# Patient Record
Sex: Female | Born: 1975 | Race: Black or African American | Hispanic: No | Smoking: Never smoker
Health system: Southern US, Community
[De-identification: ages and names within clinical notes are randomized; demographics above are authoritative.]

## PROBLEM LIST (undated history)

## (undated) DIAGNOSIS — I10 Essential (primary) hypertension: Secondary | ICD-10-CM

## (undated) HISTORY — PX: TUBAL LIGATION: SHX77

---

## 2004-10-04 ENCOUNTER — Emergency Department: Payer: Self-pay | Admitting: Unknown Physician Specialty

## 2005-05-13 ENCOUNTER — Emergency Department: Payer: Self-pay | Admitting: Emergency Medicine

## 2005-05-19 ENCOUNTER — Emergency Department: Payer: Self-pay | Admitting: Emergency Medicine

## 2005-12-17 ENCOUNTER — Ambulatory Visit: Payer: Self-pay | Admitting: Family Medicine

## 2010-01-16 ENCOUNTER — Emergency Department: Payer: Self-pay | Admitting: Emergency Medicine

## 2010-02-03 ENCOUNTER — Encounter: Payer: Self-pay | Admitting: Obstetrics & Gynecology

## 2010-04-14 ENCOUNTER — Encounter: Payer: Self-pay | Admitting: Maternal and Fetal Medicine

## 2010-06-30 ENCOUNTER — Observation Stay: Payer: Self-pay | Admitting: Obstetrics and Gynecology

## 2010-09-04 ENCOUNTER — Inpatient Hospital Stay: Payer: Self-pay | Admitting: Obstetrics and Gynecology

## 2011-06-30 ENCOUNTER — Emergency Department: Payer: Self-pay | Admitting: Emergency Medicine

## 2014-06-13 ENCOUNTER — Emergency Department: Payer: Self-pay | Admitting: Emergency Medicine

## 2014-06-13 LAB — URINALYSIS, COMPLETE
Bacteria: NONE SEEN
Bilirubin,UR: NEGATIVE
Blood: NEGATIVE
Glucose,UR: NEGATIVE mg/dL (ref 0–75)
KETONE: NEGATIVE
LEUKOCYTE ESTERASE: NEGATIVE
Nitrite: NEGATIVE
PROTEIN: NEGATIVE
Ph: 6 (ref 4.5–8.0)
SPECIFIC GRAVITY: 1.005 (ref 1.003–1.030)
Squamous Epithelial: 4
WBC UR: 2 /HPF (ref 0–5)

## 2014-06-13 LAB — HCG, QUANTITATIVE, PREGNANCY: Beta Hcg, Quant.: <1 m[IU]/mL — ABNORMAL LOW

## 2014-06-13 LAB — COMPREHENSIVE METABOLIC PANEL
ALBUMIN: 3.1 g/dL — AB (ref 3.4–5.0)
Alkaline Phosphatase: 67 U/L
Anion Gap: 15 (ref 7–16)
BILIRUBIN TOTAL: 0.3 mg/dL (ref 0.2–1.0)
BUN: 11 mg/dL (ref 7–18)
CHLORIDE: 111 mmol/L — AB (ref 98–107)
Calcium, Total: 8.1 mg/dL — ABNORMAL LOW (ref 8.5–10.1)
Co2: 15 mmol/L — ABNORMAL LOW (ref 21–32)
Creatinine: 0.73 mg/dL (ref 0.60–1.30)
EGFR (African American): >60
EGFR (Non-African Amer.): >60
Glucose: 83 mg/dL (ref 65–99)
OSMOLALITY: 280 (ref 275–301)
POTASSIUM: 4.1 mmol/L (ref 3.5–5.1)
SGOT(AST): 30 U/L (ref 15–37)
SGPT (ALT): 32 U/L
Sodium: 141 mmol/L (ref 136–145)
Total Protein: 6.5 g/dL (ref 6.4–8.2)

## 2014-06-13 LAB — CBC
HCT: 44.1 % (ref 35.0–47.0)
HGB: 14.2 g/dL (ref 12.0–16.0)
MCH: 26.5 pg (ref 26.0–34.0)
MCHC: 32.3 g/dL (ref 32.0–36.0)
MCV: 82 fL (ref 80–100)
PLATELETS: 192 10*3/uL (ref 150–440)
RBC: 5.36 10*6/uL — ABNORMAL HIGH (ref 3.80–5.20)
RDW: 16.1 % — ABNORMAL HIGH (ref 11.5–14.5)
WBC: 14.9 10*3/uL — ABNORMAL HIGH (ref 3.6–11.0)

## 2014-06-13 LAB — TROPONIN I: Troponin-I: <0.02 ng/mL

## 2016-04-29 ENCOUNTER — Emergency Department: Payer: 59

## 2016-04-29 ENCOUNTER — Emergency Department
Admission: EM | Admit: 2016-04-29 | Discharge: 2016-04-29 | Disposition: A | Discharge status: Home / Self Care | Payer: 59 | Attending: Emergency Medicine | Admitting: Emergency Medicine

## 2016-04-29 ENCOUNTER — Encounter: Payer: Self-pay | Admitting: Occupational Medicine

## 2016-04-29 DIAGNOSIS — Y92410 Unspecified street and highway as the place of occurrence of the external cause: Secondary | ICD-10-CM | POA: Insufficient documentation

## 2016-04-29 DIAGNOSIS — S8011XA Contusion of right lower leg, initial encounter: Secondary | ICD-10-CM | POA: Diagnosis not present

## 2016-04-29 DIAGNOSIS — Y9389 Activity, other specified: Secondary | ICD-10-CM | POA: Diagnosis not present

## 2016-04-29 DIAGNOSIS — Y999 Unspecified external cause status: Secondary | ICD-10-CM | POA: Diagnosis not present

## 2016-04-29 DIAGNOSIS — M79604 Pain in right leg: Secondary | ICD-10-CM | POA: Diagnosis present
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
## 2016-04-29 NOTE — ED Notes (Signed)
Discharge instructions reviewed with patient. Questions fielded by this RN. Patient verbalizes understanding of instructions. Patient discharged home in stable condition per Forbach MD . No acute distress noted at time of discharge.   

## 2016-04-29 NOTE — ED Notes (Signed)
MD at bedside. 

## 2016-08-28 ENCOUNTER — Emergency Department
Admission: EM | Admit: 2016-08-28 | Discharge: 2016-08-28 | Disposition: A | Discharge status: Home / Self Care | Payer: 59 | Attending: Emergency Medicine | Admitting: Emergency Medicine

## 2016-08-28 ENCOUNTER — Encounter: Payer: Self-pay | Admitting: Emergency Medicine

## 2016-08-28 DIAGNOSIS — R42 Dizziness and giddiness: Secondary | ICD-10-CM | POA: Insufficient documentation

## 2016-08-28 DIAGNOSIS — Z79899 Other long term (current) drug therapy: Secondary | ICD-10-CM | POA: Insufficient documentation

## 2016-08-28 LAB — URINALYSIS, COMPLETE (UACMP) WITH MICROSCOPIC
Bacteria, UA: NONE SEEN
Bilirubin Urine: NEGATIVE
Glucose, UA: NEGATIVE mg/dL
Hgb urine dipstick: NEGATIVE
Ketones, ur: 5 mg/dL — AB
Nitrite: NEGATIVE
PH: 6 (ref 5.0–8.0)
Protein, ur: 30 mg/dL — AB
SPECIFIC GRAVITY, URINE: 1.015 (ref 1.005–1.030)

## 2016-08-28 LAB — POCT PREGNANCY, URINE: Preg Test, Ur: NEGATIVE

## 2016-08-28 LAB — BASIC METABOLIC PANEL
Anion gap: 7 (ref 5–15)
BUN: 9 mg/dL (ref 6–20)
CHLORIDE: 107 mmol/L (ref 101–111)
CO2: 23 mmol/L (ref 22–32)
CREATININE: 0.62 mg/dL (ref 0.44–1.00)
Calcium: 9 mg/dL (ref 8.9–10.3)
GFR calc Af Amer: >60 mL/min (ref >60)
GFR calc non Af Amer: >60 mL/min (ref >60)
Glucose, Bld: 120 mg/dL — ABNORMAL HIGH (ref 65–99)
Potassium: 3.4 mmol/L — ABNORMAL LOW (ref 3.5–5.1)
SODIUM: 137 mmol/L (ref 135–145)

## 2016-08-28 LAB — CBC
HCT: 36.7 % (ref 35.0–47.0)
Hemoglobin: 12.7 g/dL (ref 12.0–16.0)
MCH: 26.4 pg (ref 26.0–34.0)
MCHC: 34.5 g/dL (ref 32.0–36.0)
MCV: 76.4 fL — AB (ref 80.0–100.0)
PLATELETS: 232 10*3/uL (ref 150–440)
RBC: 4.8 MIL/uL (ref 3.80–5.20)
RDW: 16.2 % — ABNORMAL HIGH (ref 11.5–14.5)
WBC: 13 10*3/uL — AB (ref 3.6–11.0)

## 2016-08-28 MED ORDER — SODIUM CHLORIDE 0.9 % IV SOLN
Freq: Once | INTRAVENOUS | Status: AC
  Administered 2016-08-28: 22:00:00 via INTRAVENOUS

## 2016-08-28 MED ORDER — DIAZEPAM 5 MG PO TABS
5.0000 mg | ORAL_TABLET | Freq: Once | ORAL | Status: AC
  Administered 2016-08-28: 5 mg via ORAL
  Filled 2016-08-28: qty 1

## 2016-08-28 MED ORDER — DIAZEPAM 5 MG PO TABS: 5.0000 mg | ORAL_TABLET | Freq: Three times a day (TID) | ORAL | 0 refills | Status: DC | PRN

## 2016-08-28 MED ORDER — MECLIZINE HCL 25 MG PO TABS
50.0000 mg | ORAL_TABLET | Freq: Once | ORAL | Status: AC
  Administered 2016-08-28: 50 mg via ORAL
  Filled 2016-08-28: qty 2

## 2016-08-28 NOTE — ED Provider Notes (Signed)
[facility] Emergency Department Provider Note        Time seen: ----------------------------------------- 10:10 PM on 08/28/2016 -----------------------------------------    I have reviewed the triage vital signs and the nursing notes.   HISTORY  Chief Complaint Near Syncope    HPI Kaylee Malone is a 41 y.o. female who presents to the ER after feeling lightheaded and dizzy while at work today. Patient reports EMS was called and she felt like she was going to be fine but later today she went to [facility] and was diagnosed with vertigo.Patient states she took the medicine for vertigo but it did not significantly improve her symptoms. She denies fevers, chills or other complaints. She has had a recent viral illness.   History reviewed. No pertinent past medical history.  There are no active problems to display for this patient.   Past Surgical History:  Procedure Laterality Date  . CESAREAN SECTION     x3    Allergies Codeine  Social History Social History  Substance Use Topics  . Smoking status: Never Smoker  . Smokeless tobacco: Never Used  . Alcohol use No    Review of Systems Constitutional: Negative for fever. Cardiovascular: Negative for chest pain. Respiratory: Negative for shortness of breath. Gastrointestinal: Negative for abdominal pain, vomiting and diarrhea. Genitourinary: Negative for dysuria. Musculoskeletal: Negative for back pain. Skin: Negative for rash. Neurological: Negative for headaches, focal weakness or numbness.Positive for vertigo  10-point ROS otherwise negative.  ____________________________________________   PHYSICAL EXAM:  VITAL SIGNS: ED Triage Vitals  Enc Vitals Group     BP 08/28/16 2134 (!) 178/92     Pulse Rate 08/28/16 2134 92     Resp 08/28/16 2134 18     Temp 08/28/16 2134 99.2 F (37.3 C)     Temp Source 08/28/16 2134 Oral     SpO2 08/28/16 2134 95 %     Weight 08/28/16 2135  176 lb (79.8 kg)     Height 08/28/16 2135 5\' 7"  (1.702 m)     Head Circumference --      Peak Flow --      Pain Score 08/28/16 2141 0     Pain Loc --      Pain Edu? --      Excl. in GC? --     Constitutional: Alert and oriented. Well appearing and in no distress. Eyes: Conjunctivae are normal. PERRL. Normal extraocular movements. ENT   Head: Normocephalic and atraumatic.   Nose: No congestion/rhinnorhea.   Mouth/Throat: Mucous membranes are moist.   Neck: No stridor. Cardiovascular: Normal rate, regular rhythm. No murmurs, rubs, or gallops. Respiratory: Normal respiratory effort without tachypnea nor retractions. Breath sounds are clear and equal bilaterally. No wheezes/rales/rhonchi. Gastrointestinal: Soft and nontender. Normal bowel sounds Musculoskeletal: Nontender with normal range of motion in all extremities. No lower extremity tenderness nor edema. Neurologic:  Normal speech and language. No gross focal neurologic deficits are appreciated.  Skin:  Skin is warm, dry and intact. No rash noted. Psychiatric: Mood and affect are normal. Speech and behavior are normal.  ____________________________________________  EKG: Interpreted by me. Sinus rhythm rate of 93 bpm, normal PR intervals, normal QRS, normal QT, normal axis.  ____________________________________________  ED COURSE:  Pertinent labs & imaging results that were available during my care of the patient were reviewed by me and considered in my medical decision making (see chart for details). Patient is in no distress, clinically with peripheral vertigo. We will assess with basic  labs and give IV fluids, Valium orally and meclizine.   Procedures ____________________________________________   LABS (pertinent positives/negatives)  Labs Reviewed  BASIC METABOLIC PANEL - Abnormal; Notable for the following:       Result Value   Potassium 3.4 (*)    Glucose, Bld 120 (*)    All other components within  normal limits  CBC - Abnormal; Notable for the following:    WBC 13.0 (*)    MCV 76.4 (*)    RDW 16.2 (*)    All other components within normal limits  URINALYSIS, COMPLETE (UACMP) WITH MICROSCOPIC - Abnormal; Notable for the following:    Color, Urine YELLOW (*)    APPearance HAZY (*)    Ketones, ur 5 (*)    Protein, ur 30 (*)    Leukocytes, UA SMALL (*)    Squamous Epithelial / LPF 6-30 (*)    All other components within normal limits  POCT PREGNANCY, URINE  CBG MONITORING, ED   ____________________________________________  FINAL ASSESSMENT AND PLAN  Vertigo  Plan: Patient with labs as dictated above. Patient is currently feeling better, vertigo has subsided. She is sitting up and appears improved. She'll be discharged and encouraged to take her meclizine and Valium. She is stable for ENT follow-up if symptoms persist.   Emily Filbert, MD   Note: This note was generated in part or whole with voice recognition software. Voice recognition is usually quite accurate but there are transcription errors that can and very often do occur. I apologize for any typographical errors that were not detected and corrected.     Emily Filbert, MD 08/28/16 [phone]

## 2016-08-28 NOTE — ED Triage Notes (Signed)
Pt states that she was walking at work today and felt light headed and dizzy. Pt reports that EMS was called by a customer and she felt that she was going to be fine at the time. Pt states that later today [facility] went [facility] and was diagnosed with [facility]. [facility] prescribed her Meclizine 25mg , Benzonanate 200mg , and amoxicillin. Pt is in NAD at this time.

## 2016-08-28 NOTE — ED Notes (Signed)
Pt states was diagnosed with vertigo and given antivert [facility]. Pt states she has the sensation of "like i'm in a tornado". Pt states she had 3 episodes of emesis related to vertigo. Pt denies diarrhea, fever. Pt states vertigo improves with lying flat. Skin normal color warm and dry. Pt states she was at work earlier today when she became very dizzy and felt like she was going to faint.

## 2017-01-03 ENCOUNTER — Emergency Department: Payer: 59

## 2017-01-03 ENCOUNTER — Emergency Department
Admission: EM | Admit: 2017-01-03 | Discharge: 2017-01-03 | Disposition: A | Discharge status: Home / Self Care | Payer: 59 | Attending: Emergency Medicine | Admitting: Emergency Medicine

## 2017-01-03 ENCOUNTER — Encounter: Payer: Self-pay | Admitting: Emergency Medicine

## 2017-01-03 DIAGNOSIS — Z885 Allergy status to narcotic agent status: Secondary | ICD-10-CM | POA: Insufficient documentation

## 2017-01-03 DIAGNOSIS — R000 Tachycardia, unspecified: Secondary | ICD-10-CM | POA: Insufficient documentation

## 2017-01-03 LAB — BASIC METABOLIC PANEL
ANION GAP: 9 (ref 5–15)
BUN: 11 mg/dL (ref 6–20)
CALCIUM: 9.5 mg/dL (ref 8.9–10.3)
CO2: 25 mmol/L (ref 22–32)
CREATININE: 0.81 mg/dL (ref 0.44–1.00)
Chloride: 103 mmol/L (ref 101–111)
Glucose, Bld: 161 mg/dL — ABNORMAL HIGH (ref 65–99)
Potassium: 2.8 mmol/L — ABNORMAL LOW (ref 3.5–5.1)
SODIUM: 137 mmol/L (ref 135–145)

## 2017-01-03 LAB — URINALYSIS, COMPLETE (UACMP) WITH MICROSCOPIC
BILIRUBIN URINE: NEGATIVE
GLUCOSE, UA: NEGATIVE mg/dL
Hgb urine dipstick: NEGATIVE
KETONES UR: NEGATIVE mg/dL
NITRITE: NEGATIVE
PH: 6 (ref 5.0–8.0)
Protein, ur: NEGATIVE mg/dL
SPECIFIC GRAVITY, URINE: 1.01 (ref 1.005–1.030)

## 2017-01-03 LAB — POCT PREGNANCY, URINE: Preg Test, Ur: NEGATIVE

## 2017-01-03 LAB — TROPONIN I

## 2017-01-03 LAB — CBC
HCT: 39 % (ref 35.0–47.0)
HEMOGLOBIN: 13.7 g/dL (ref 12.0–16.0)
MCH: 27.7 pg (ref 26.0–34.0)
MCHC: 35.3 g/dL (ref 32.0–36.0)
MCV: 78.5 fL — ABNORMAL LOW (ref 80.0–100.0)
PLATELETS: 255 10*3/uL (ref 150–440)
RBC: 4.96 MIL/uL (ref 3.80–5.20)
RDW: 15.7 % — ABNORMAL HIGH (ref 11.5–14.5)
WBC: 11.4 10*3/uL — AB (ref 3.6–11.0)

## 2017-01-03 MED ORDER — LORAZEPAM 0.5 MG PO TABS: 0.5000 mg | ORAL_TABLET | Freq: Three times a day (TID) | ORAL | 0 refills | Status: AC | PRN

## 2017-01-03 MED ORDER — POTASSIUM CHLORIDE CRYS ER 20 MEQ PO TBCR
40.0000 meq | EXTENDED_RELEASE_TABLET | Freq: Once | ORAL | Status: AC
  Administered 2017-01-03: 40 meq via ORAL
  Filled 2017-01-03: qty 2

## 2017-01-03 MED ORDER — LORAZEPAM 2 MG/ML IJ SOLN
1.0000 mg | Freq: Once | INTRAMUSCULAR | Status: AC
  Administered 2017-01-03: 1 mg via INTRAVENOUS
  Filled 2017-01-03: qty 1

## 2017-01-03 NOTE — ED Provider Notes (Addendum)
[facility] Emergency Department Provider Note       Time seen: ----------------------------------------- 9:03 PM on 01/03/2017 -----------------------------------------     I have reviewed the triage vital signs and the nursing notes.   HISTORY   Chief Complaint Tachycardia    HPI Kaylee Malone is a 41 y.o. female who presents to the ED for feeling like her heart was racing at home. Patient reported heart rate at home of 160 bpm. Patient states she was taken off of amlodipine recently because he was making her tachycardic. She is brought in to triage diaphoretic, does not have any pain at this time. She is also taking hydroxyzine as prescribed by her doctor for anxiety.   History reviewed. No pertinent past medical history.  There are no active problems to display for this patient.   Past Surgical History:  Procedure Laterality Date  . CESAREAN SECTION     x3    Allergies Codeine  Social History Social History  Substance Use Topics  . Smoking status: Never Smoker  . Smokeless tobacco: Never Used  . Alcohol use No    Review of Systems Constitutional: Negative for fever. Eyes: Negative for vision changes ENT:  Negative for congestion, sore throat Cardiovascular: Negative for chest pain.Positive for palpitations Respiratory: Negative for shortness of breath. Gastrointestinal: Negative for abdominal pain, vomiting and diarrhea. Genitourinary: Negative for dysuria. Musculoskeletal: Negative for back pain. Skin: Negative for rash. Positive for diaphoresis Neurological: Negative for headaches, focal weakness or numbness.  All systems negative/normal/unremarkable except as stated in the HPI  ____________________________________________   PHYSICAL EXAM:  VITAL SIGNS: ED Triage Vitals  Enc Vitals Group     BP 01/03/17 2044 (!) 156/108     Pulse Rate 01/03/17 2044 (!) 140     Resp 01/03/17 2044 18     Temp 01/03/17 2044 98.9 F  (37.2 C)     Temp Source 01/03/17 2044 Oral     SpO2 01/03/17 2044 97 %     Weight 01/03/17 2049 186 lb (84.4 kg)     Height 01/03/17 2049 5\' 7"  (1.702 m)     Head Circumference --      Peak Flow --      Pain Score 01/03/17 2044 0     Pain Loc --      Pain Edu? --      Excl. in GC? --     Constitutional: Alert and oriented. Anxious, no distress Eyes: Conjunctivae are normal. Normal extraocular movements. ENT   Head: Normocephalic and atraumatic.   Nose: No congestion/rhinnorhea.   Mouth/Throat: Mucous membranes are moist.   Neck: No stridor. Cardiovascular: Rapid rate, regular rhythm. No murmurs, rubs, or gallops. Respiratory: Normal respiratory effort without tachypnea nor retractions. Breath sounds are clear and equal bilaterally. No wheezes/rales/rhonchi. Gastrointestinal: Soft and nontender. Normal bowel sounds Musculoskeletal: Nontender with normal range of motion in extremities. No lower extremity tenderness nor edema. Neurologic:  Normal speech and language. No gross focal neurologic deficits are appreciated.  Skin:  Skin is warm, dry and intact. No rash noted. Psychiatric: Mood and affect are normal. Speech and behavior are normal.  ____________________________________________  EKG: Interpreted by me. Sinus tachycardia rate of 141 bpm, normal PR interval, normal QRS, normal QT.  ____________________________________________  ED COURSE:  Pertinent labs & imaging results that were available during my care of the patient were reviewed by me and considered in my medical decision making (see chart for details). Patient presents for tachycardia, we will  assess with labs and imaging as indicated.   Procedures ____________________________________________   LABS (pertinent positives/negatives)  Labs Reviewed  BASIC METABOLIC PANEL - Abnormal; Notable for the following:       Result Value   Potassium 2.8 (*)    Glucose, Bld 161 (*)    All other components  within normal limits  CBC - Abnormal; Notable for the following:    WBC 11.4 (*)    MCV 78.5 (*)    RDW 15.7 (*)    All other components within normal limits  URINALYSIS, COMPLETE (UACMP) WITH MICROSCOPIC - Abnormal; Notable for the following:    Color, Urine YELLOW (*)    APPearance HAZY (*)    Leukocytes, UA MODERATE (*)    Bacteria, UA RARE (*)    Squamous Epithelial / LPF 0-5 (*)    All other components within normal limits  TROPONIN I  POC URINE PREG, ED  POCT PREGNANCY, URINE    RADIOLOGY Images were viewed by me  Chest x-ray IMPRESSION: Negative.  No acute cardiopulmonary abnormality.  ____________________________________________  FINAL ASSESSMENT AND PLAN  Tachycardia  Plan: Patient's labs and imaging were dictated above. Patient had presented for Tachycardia of uncertain etiology. There is certainly an anxiety component to this and I will prescribe Ativan for same. I advised her to take her sotalol as previously prescribed and outpatient follow-up for Holter monitoring. She is feeling better and stable for discharge.   Emily Filbert, MD   Note: This note was generated in part or whole with voice recognition software. Voice recognition is usually quite accurate but there are transcription errors that can and very often do occur. I apologize for any typographical errors that were not detected and corrected.     Emily Filbert, MD 01/03/17 2316    Emily Filbert, MD 01/03/17 2330    Emily Filbert, MD 01/03/17 [phone]

## 2017-01-03 NOTE — ED Notes (Signed)
Pt back in room from xray. Pt ambulated independently to the bathroom in room with a steady gait.

## 2017-01-03 NOTE — ED Triage Notes (Signed)
Pt states that has hx of HTN and felt like her heart was racing at home. Pt reported HR at home of 160. Pt also states that she was change to Amlodipine recently. Pt is diaphoretic at this time in triage.

## 2017-08-09 IMAGING — CR DG CHEST 2V
2 series · 2 of 2 positions shown · non-contrast
Comparison: None.

CLINICAL DATA: 41-year-old female with palpitations.  Tachycardia.

EXAM:
CHEST  2 VIEW

[chest pa]
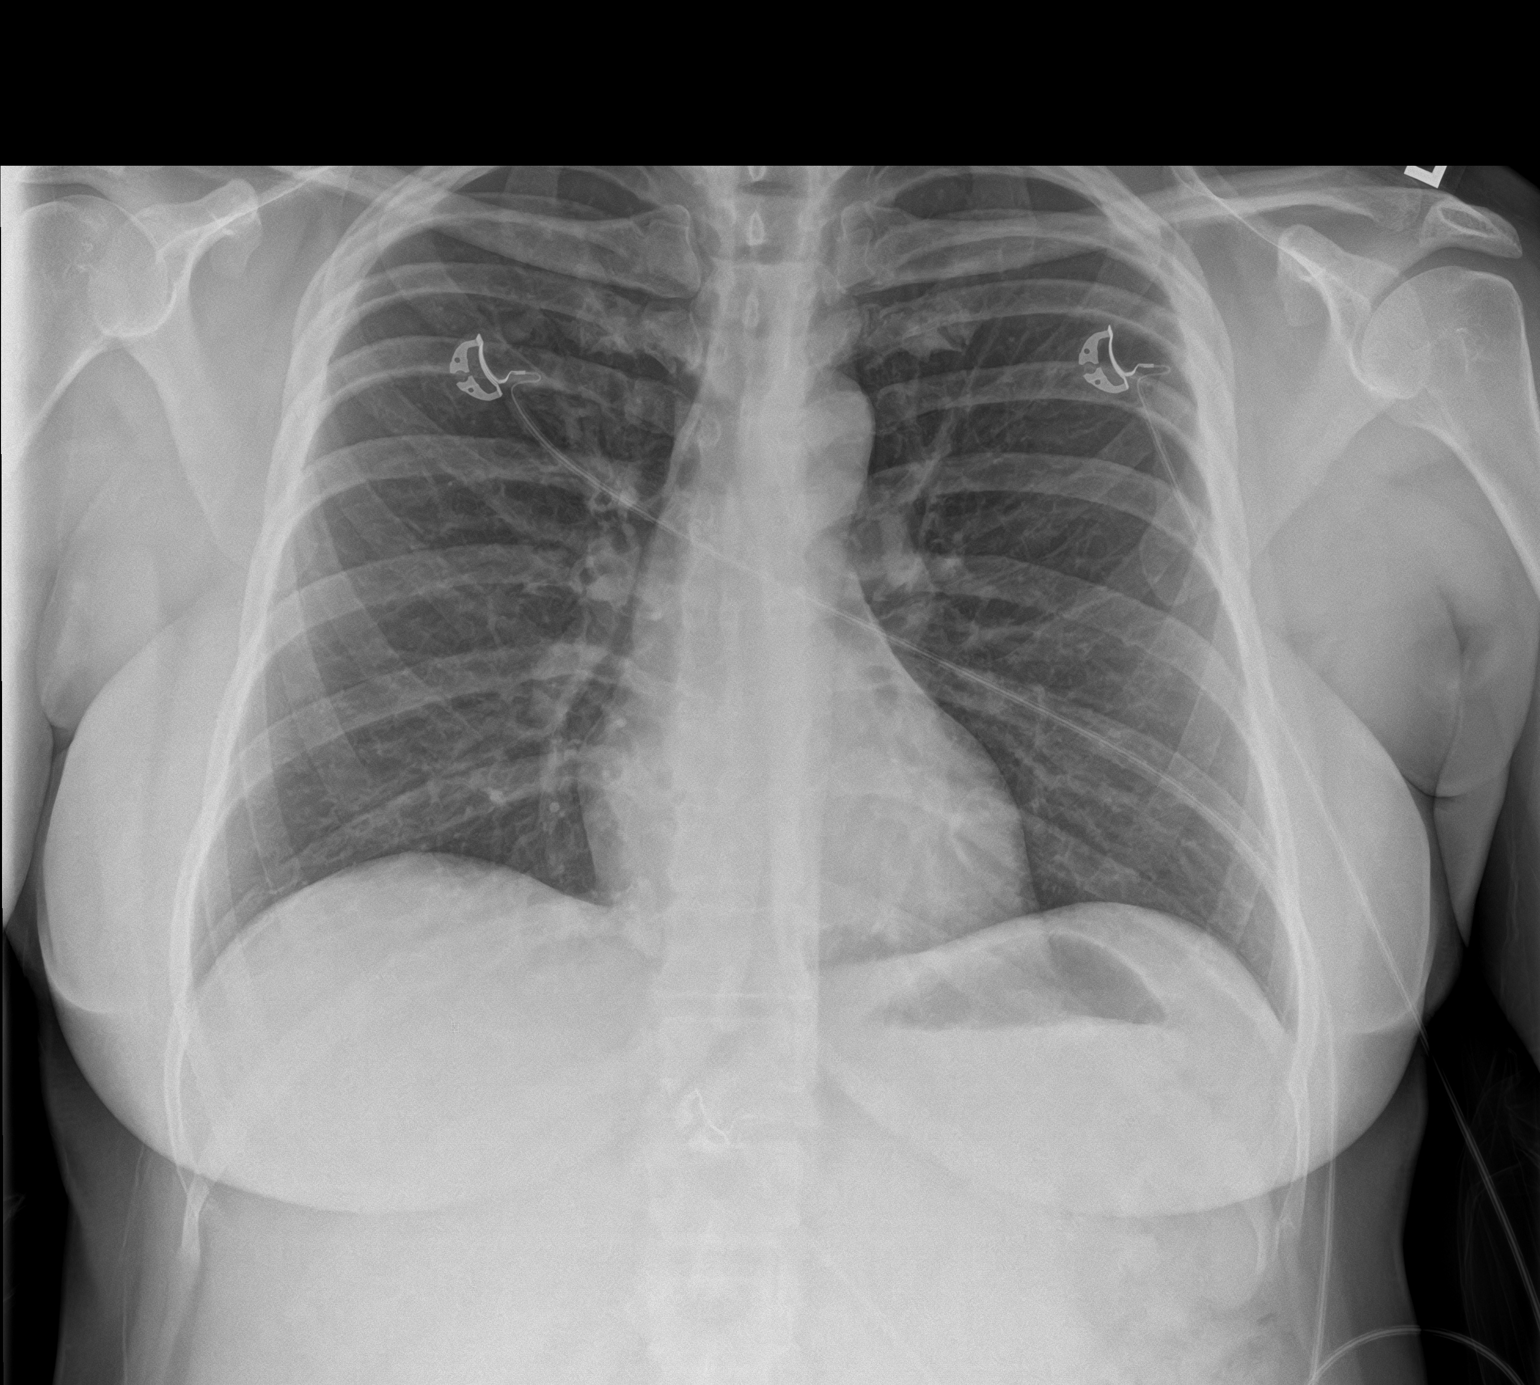

[chest lat]
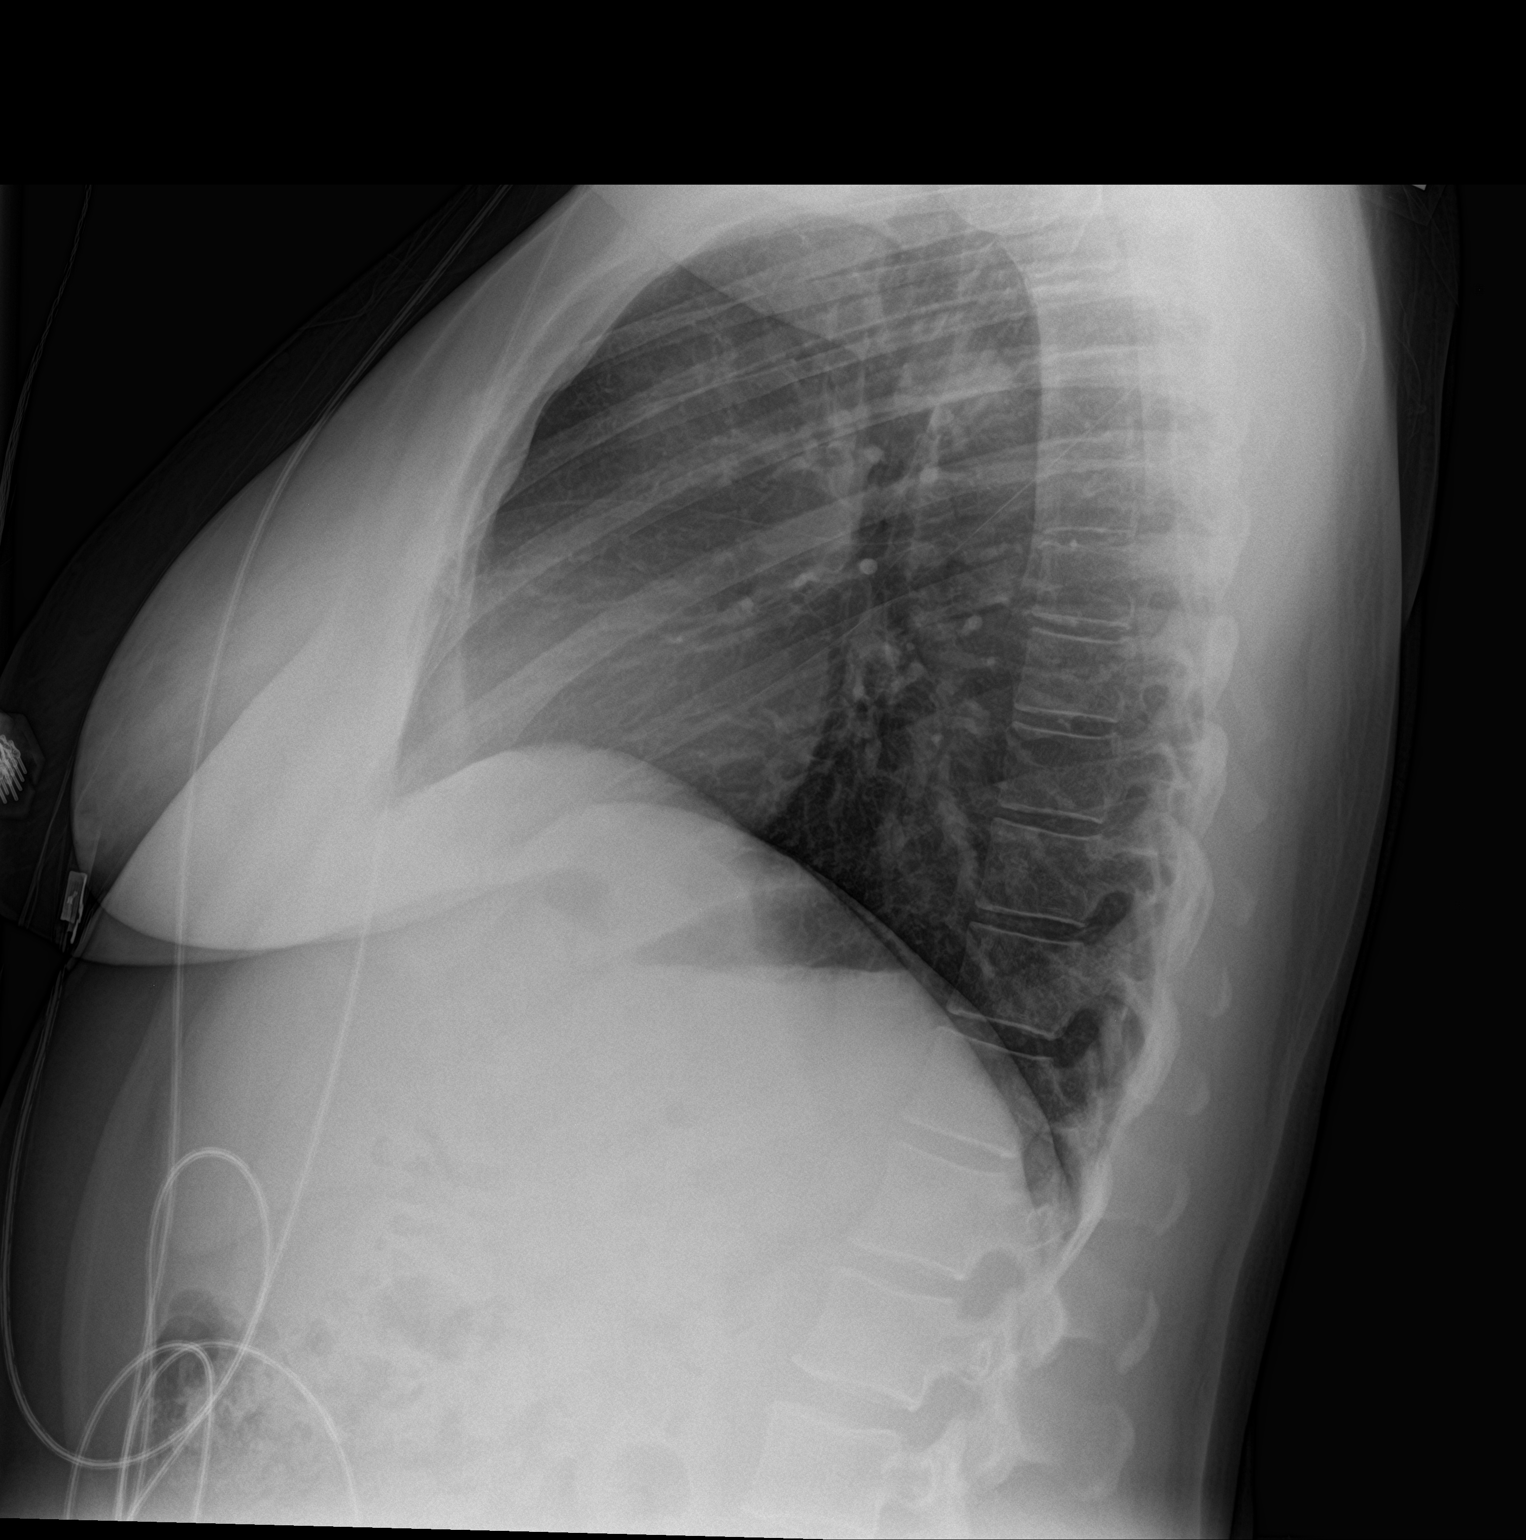

[2 of 2 positions shown; findings below may reference images not displayed]

FINDINGS: Normal lung volumes. Normal cardiac size and mediastinal contours.
Visualized tracheal air column is within normal limits. The lungs
are clear. No pneumothorax or pleural effusion. Negative visible
bowel gas pattern. No osseous abnormality identified.
IMPRESSION: Negative.  No acute cardiopulmonary abnormality.

## 2018-06-10 ENCOUNTER — Other Ambulatory Visit: Payer: Self-pay

## 2018-06-10 ENCOUNTER — Encounter: Payer: Self-pay | Admitting: Emergency Medicine

## 2018-06-10 ENCOUNTER — Emergency Department
Admission: EM | Admit: 2018-06-10 | Discharge: 2018-06-10 | Disposition: A | Discharge status: Home / Self Care | Payer: 59 | Attending: Emergency Medicine | Admitting: Emergency Medicine

## 2018-06-10 DIAGNOSIS — R319 Hematuria, unspecified: Secondary | ICD-10-CM | POA: Insufficient documentation

## 2018-06-10 DIAGNOSIS — Z79899 Other long term (current) drug therapy: Secondary | ICD-10-CM | POA: Insufficient documentation

## 2018-06-10 DIAGNOSIS — A599 Trichomoniasis, unspecified: Secondary | ICD-10-CM

## 2018-06-10 DIAGNOSIS — I10 Essential (primary) hypertension: Secondary | ICD-10-CM | POA: Insufficient documentation

## 2018-06-10 DIAGNOSIS — N760 Acute vaginitis: Secondary | ICD-10-CM | POA: Insufficient documentation

## 2018-06-10 DIAGNOSIS — B9689 Other specified bacterial agents as the cause of diseases classified elsewhere: Secondary | ICD-10-CM

## 2018-06-10 DIAGNOSIS — R109 Unspecified abdominal pain: Secondary | ICD-10-CM

## 2018-06-10 HISTORY — DX: Essential (primary) hypertension: I10

## 2018-06-10 LAB — COMPREHENSIVE METABOLIC PANEL
ALT: 45 U/L — AB (ref 0–44)
AST: 34 U/L (ref 15–41)
Albumin: 4.2 g/dL (ref 3.5–5.0)
Alkaline Phosphatase: 56 U/L (ref 38–126)
Anion gap: 5 (ref 5–15)
BUN: 11 mg/dL (ref 6–20)
CALCIUM: 9.3 mg/dL (ref 8.9–10.3)
CHLORIDE: 109 mmol/L (ref 98–111)
CO2: 26 mmol/L (ref 22–32)
CREATININE: 0.66 mg/dL (ref 0.44–1.00)
Glucose, Bld: 88 mg/dL (ref 70–99)
Potassium: 3.9 mmol/L (ref 3.5–5.1)
Sodium: 140 mmol/L (ref 135–145)
Total Bilirubin: 0.6 mg/dL (ref 0.3–1.2)
Total Protein: 7.4 g/dL (ref 6.5–8.1)

## 2018-06-10 LAB — CBC
HEMATOCRIT: 37.3 % (ref 36.0–46.0)
Hemoglobin: 12.9 g/dL (ref 12.0–15.0)
MCH: 28 pg (ref 26.0–34.0)
MCHC: 34.6 g/dL (ref 30.0–36.0)
MCV: 80.9 fL (ref 80.0–100.0)
PLATELETS: 245 10*3/uL (ref 150–400)
RBC: 4.61 MIL/uL (ref 3.87–5.11)
RDW: 14.5 % (ref 11.5–15.5)
WBC: 13.9 10*3/uL — AB (ref 4.0–10.5)
nRBC: 0 % (ref 0.0–0.2)

## 2018-06-10 LAB — POCT PREGNANCY, URINE: Preg Test, Ur: NEGATIVE

## 2018-06-10 LAB — URINALYSIS, COMPLETE (UACMP) WITH MICROSCOPIC
Bilirubin Urine: NEGATIVE
GLUCOSE, UA: NEGATIVE mg/dL
HGB URINE DIPSTICK: NEGATIVE
Ketones, ur: NEGATIVE mg/dL
Nitrite: NEGATIVE
Protein, ur: 30 mg/dL — AB
SPECIFIC GRAVITY, URINE: 1.03 (ref 1.005–1.030)
pH: 5 (ref 5.0–8.0)

## 2018-06-10 LAB — WET PREP, GENITAL
Sperm: NONE SEEN
YEAST WET PREP: NONE SEEN

## 2018-06-10 LAB — CHLAMYDIA/NGC RT PCR (ARMC ONLY)
Chlamydia Tr: NOT DETECTED
N GONORRHOEAE: NOT DETECTED

## 2018-06-10 LAB — LIPASE, BLOOD: LIPASE: 41 U/L (ref 11–51)

## 2018-06-10 MED ORDER — FOSFOMYCIN TROMETHAMINE 3 G PO PACK
3.0000 g | PACK | Freq: Once | ORAL | Status: AC
  Administered 2018-06-10: 3 g via ORAL
  Filled 2018-06-10: qty 3

## 2018-06-10 MED ORDER — AZITHROMYCIN 500 MG PO TABS
1000.0000 mg | ORAL_TABLET | Freq: Once | ORAL | Status: AC
  Administered 2018-06-10: 1000 mg via ORAL
  Filled 2018-06-10: qty 2

## 2018-06-10 MED ORDER — CEFTRIAXONE SODIUM 250 MG IJ SOLR
250.0000 mg | Freq: Once | INTRAMUSCULAR | Status: AC
  Administered 2018-06-10: 250 mg via INTRAMUSCULAR
  Filled 2018-06-10: qty 250

## 2018-06-10 MED ORDER — LIDOCAINE HCL (PF) 1 % IJ SOLN
5.0000 mL | Freq: Once | INTRAMUSCULAR | Status: AC
  Administered 2018-06-10: 5 mL

## 2018-06-10 MED ORDER — LIDOCAINE HCL (PF) 1 % IJ SOLN
INTRAMUSCULAR | Status: AC
  Administered 2018-06-10: 5 mL
  Filled 2018-06-10: qty 5

## 2018-06-10 MED ORDER — METRONIDAZOLE 500 MG PO TABS: 500.0000 mg | ORAL_TABLET | Freq: Two times a day (BID) | ORAL | 0 refills | Status: DC

## 2018-06-10 NOTE — Discharge Instructions (Addendum)
Return to the emergency room for any new or worrisome symptoms including increased pain, vomiting, fever, diarrhea, or if you feel worse in any way.  We were not sure if he had a urinary tract infection here, we did give you a single dose of antibiotics which hopefully will cover, but if you are finding suggest the need for further antibiotics we will call you.  In the meantime, please follow-up closely with OB/GYN for history of abnormal vaginal bleeding, and if you have other concerns we are certainly happy to help

## 2018-06-10 NOTE — ED Triage Notes (Signed)
Pt to ED Via POV c/o abdominal pain and hematuria since this morning. Pt states that the pain is located in the LLQ. Pt states that the pain is sharp and it comes and goes. Pt also states that her vagina feels irritated. Pt is in NAD at this time.

## 2018-06-10 NOTE — ED Provider Notes (Addendum)
[facility] Emergency Department Provider Note  ____________________________________________   I have reviewed the triage vital signs and the nursing notes. Where available I have reviewed prior notes and, if possible and indicated, outside hospital notes.    HISTORY  Chief Complaint Abdominal Pain and Hematuria    HPI Kaylee Malone is a 42 y.o. 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  It is less than a period.  She is not pregnant she has had her tubes tied.  She denies any vaginal discharge.  She has no ongoing pain she denies any fever chills nausea or vomiting.  She has no dysuria or urinary symptoms.  She states that she otherwise feels "pretty good".  She does have an OB/GYN with whom she has followed but not recently.   Past Medical History:  Diagnosis Date  . Hypertension     There are no active problems to display for this patient.   Past Surgical History:  Procedure Laterality Date  . CESAREAN SECTION     x3  . TUBAL LIGATION      Prior to Admission medications   Medication Sig Start Date End Date Taking? Authorizing Provider  benzonatate (TESSALON) 200 MG capsule Take 200 mg by mouth 3 (three) times daily as needed for cough.    [provider]  diazepam (VALIUM) 5 MG tablet Take 1 tablet (5 mg total) by mouth every 8 (eight) hours as needed. To be taken 3 times daily with meclizine for vertigo 08/28/16   Emily FilbertWilliams, Jonathan E, MD  fluticasone [employer](FLONASE) 50 MCG/ACT nasal spray Place 2 sprays into both nostrils daily.    [provider]  meclizine (ANTIVERT) 25 MG tablet Take 25 mg  by mouth 3 (three) times daily as needed for dizziness.    [provider]    Allergies Codeine  No family history on file.  Social History Social History   Tobacco Use  . Smoking status: Never Smoker  . Smokeless tobacco: Never Used  Substance Use Topics  . Alcohol use: No  . Drug use: No    Review of Systems Constitutional: No fever/chills Eyes: No visual changes. ENT: No sore throat. No stiff neck no neck pain Cardiovascular: Denies chest pain. Respiratory: Denies shortness of breath. Gastrointestinal:   no vomiting.  No diarrhea.  No constipation. Genitourinary: Negative for dysuria. Musculoskeletal: Negative lower extremity swelling Skin: Negative for rash. Neurological: Negative for severe headaches, focal weakness or numbness.   ____________________________________________   PHYSICAL EXAM:  VITAL SIGNS: ED Triage Vitals  Enc Vitals Group     BP 06/10/18 1646 (!) 154/81     Pulse Rate 06/10/18 1646 87     Resp 06/10/18 1646 16     Temp 06/10/18 1646 98.2 F (36.8 C)     Temp Source 06/10/18 1646 Oral     SpO2 06/10/18 1646 100 %     Weight 06/10/18 1648 206 lb (93.4 kg)     Height 06/10/18 1648 5\' 7"  (1.702 m)     Head Circumference --      Peak Flow --      Pain Score 06/10/18  1648 5     Pain Loc --      Pain Edu? --      Excl. in GC? --     Constitutional: Alert and oriented. Well appearing and in no acute distress. Eyes: Conjunctivae are normal Head: Atraumatic HEENT: No congestion/rhinnorhea. Mucous membranes are moist.  Oropharynx non-erythematous Neck:   Nontender with no meningismus, no masses, no stridor Cardiovascular: Normal rate, regular rhythm. Grossly normal heart sounds.  Good peripheral circulation. Respiratory: Normal respiratory effort.  No retractions. Lungs CTAB. Abdominal: Soft and nontender. No distention. No guarding no rebound Back:  There is no focal tenderness or step off.  there is no midline tenderness there  are no lesions noted. there is no CVA tenderness Pelvic exam: Female nurse chaperone present, no external lesions noted, physiologic vaginal discharge noted with no purulent discharge, no cervical motion tenderness, no adnexal tenderness or mass, there is no significant uterine tenderness or mass. No vaginal bleeding Musculoskeletal: No lower extremity tenderness, no upper extremity tenderness. No joint effusions, no DVT signs strong distal pulses no edema Neurologic:  Normal speech and language. No gross focal neurologic deficits are appreciated.  Skin:  Skin is warm, dry and intact. No rash noted. Psychiatric: Mood and affect are normal. Speech and behavior are normal.  ____________________________________________   LABS (all labs ordered are listed, but only abnormal results are displayed)  Labs Reviewed  COMPREHENSIVE METABOLIC PANEL - Abnormal; Notable for the following components:      Result Value   ALT 45 (*)    All other components within normal limits  CBC - Abnormal; Notable for the following components:   WBC 13.9 (*)    All other components within normal limits  URINALYSIS, COMPLETE (UACMP) WITH MICROSCOPIC - Abnormal; Notable for the following components:   Color, Urine YELLOW (*)    APPearance CLOUDY (*)    Protein, ur 30 (*)    Leukocytes, UA LARGE (*)    Bacteria, UA RARE (*)    All other components within normal limits  CHLAMYDIA/NGC RT PCR ([facility] ONLY)  WET PREP, GENITAL  URINE CULTURE  LIPASE, BLOOD  POC URINE PREG, ED  POCT PREGNANCY, URINE    Pertinent labs  results that were available during my care of the patient were reviewed by me and considered in my medical decision making (see chart for details). ____________________________________________  EKG  I personally interpreted any EKGs ordered by me or triage  ____________________________________________  RADIOLOGY  Pertinent labs & imaging results that were available during my care of the  patient were reviewed by me and considered in my medical decision making (see chart for details). If possible, patient and/or family made aware of any abnormal findings.  No results found. ____________________________________________    PROCEDURES  Procedure(s) performed: None  Procedures  Critical Care performed: None  ____________________________________________   INITIAL IMPRESSION / ASSESSMENT AND PLAN / ED COURSE  Pertinent labs & imaging results that were available during my care of the patient were reviewed by me and considered in my medical decision making (see chart for details).  She had some cramping and spotting earlier today she is not pregnant, there is no evidence that on exam to suggest torsion, PID, TOA, ovarian cyst rupture of any significance including hemorrhagic ovarian cyst, blood work is reassuring.  She has a little bit of leukocytosis which is chronic for her.  She does not have any hematuria, she has a poor clean-catch, which is her UA.  Does not  want a cath urine at this time.  This leaves [place] with somewhat equivocal results.  We will send a urine culture but given that she has no dysuria no urinary frequency, we will avoid long-term treatment pending culture results.  Given however those findings and elevated white count and her lower abdominal cramping I will give her a single dose of fosfomycin here patient is very comfortable with this plan and she states there is nothing wrong with her urine.  She did have some mild spotting earlier, she is not currently bleeding at this time.  Wet prep and gonorrhea collected Erenest Blank are pending although she does not feel that she likely has an STI  ----------------------------------------- 7:27 PM on 06/10/2018 -----------------------------------------  Patient is positive for trichomonas, we will treat her, and we will send her home with Flagyl.  I also treat gonorrhea and chlamydia.  Offered HIV and syphilis testing but  she refuses.  Understands the risk of refusal.  Urine cultures pending.  Did give her with single dose of fosfomycin here.  We will discharge her with close outpatient follow-up and she understands all of my customary STI instructions well as abdominal pain instructions.  Considering the patient's symptoms, medical history, and physical examination today, I have low suspicion for cholecystitis or biliary pathology, pancreatitis, perforation or bowel obstruction, hernia, intra-abdominal abscess, AAA or dissection, volvulus or intussusception, mesenteric ischemia, ischemic gut, pyelonephritis or appendicitis.    ____________________________________________   FINAL CLINICAL IMPRESSION(S) / ED DIAGNOSES  Final diagnoses:  None      This chart was dictated using voice recognition software.  Despite best efforts to proofread,  errors can occur which can change meaning.      Jeanmarie Plant, MD 06/10/18 1826    Jeanmarie Plant, MD 06/10/18 [phone]

## 2018-06-10 NOTE — ED Notes (Signed)
Pt states she generally has cramps 1 week after her period. Pt states this morning she was cramping and had blood when she wiped. Pt states her vagina feels "irritated". Pts last period was nov 19th.

## 2018-06-10 NOTE — ED Notes (Signed)
No peripheral IV placed this visit.   Discharge instructions reviewed with patient. Questions fielded by this RN. Patient verbalizes understanding of instructions. Patient discharged home in stable condition per mcshane. No acute distress noted at time of discharge.    

## 2018-06-12 LAB — URINE CULTURE: Culture: 60000 — AB

## 2018-09-03 ENCOUNTER — Other Ambulatory Visit: Payer: Self-pay

## 2018-09-03 ENCOUNTER — Emergency Department
Admission: EM | Admit: 2018-09-03 | Discharge: 2018-09-03 | Disposition: A | Discharge status: Home / Self Care | Payer: Self-pay | Attending: Emergency Medicine | Admitting: Emergency Medicine

## 2018-09-03 ENCOUNTER — Encounter: Payer: Self-pay | Admitting: Emergency Medicine

## 2018-09-03 DIAGNOSIS — R69 Illness, unspecified: Secondary | ICD-10-CM

## 2018-09-03 DIAGNOSIS — J111 Influenza due to unidentified influenza virus with other respiratory manifestations: Secondary | ICD-10-CM | POA: Insufficient documentation

## 2018-09-03 DIAGNOSIS — I10 Essential (primary) hypertension: Secondary | ICD-10-CM | POA: Insufficient documentation

## 2018-09-03 MED ORDER — IBUPROFEN 600 MG PO TABS
600.0000 mg | ORAL_TABLET | Freq: Once | ORAL | Status: AC
  Administered 2018-09-03: 600 mg via ORAL
  Filled 2018-09-03: qty 1

## 2018-09-03 MED ORDER — ONDANSETRON 4 MG PO TBDP: 4.0000 mg | ORAL_TABLET | Freq: Three times a day (TID) | ORAL | 0 refills | Status: DC | PRN

## 2018-09-03 MED ORDER — OSELTAMIVIR PHOSPHATE 75 MG PO CAPS: 75.0000 mg | ORAL_CAPSULE | Freq: Two times a day (BID) | ORAL | 0 refills | Status: DC

## 2018-09-03 MED ORDER — ONDANSETRON 4 MG PO TBDP
4.0000 mg | ORAL_TABLET | Freq: Once | ORAL | Status: AC
  Administered 2018-09-03: 4 mg via ORAL
  Filled 2018-09-03: qty 1

## 2018-09-03 NOTE — ED Triage Notes (Signed)
Cough and fever since yesterday.

## 2018-09-03 NOTE — Discharge Instructions (Signed)
Follow-up with your regular doctor if not better in 3 days.  Take Tylenol/ibuprofen for fever and pain as needed.  Drink plenty of fluids.  You been given a prescription for Tamiflu to take if you desire.  This medication does not cure the flu but will help with your symptoms.  Please also be given a prescription for Zofran this is for the nausea.  Return to the emergency department worsening.  He should not return to work until you have been fever free for 24 hours.

## 2018-09-03 NOTE — ED Notes (Signed)
Pt presents with cough that started a "few" days ago. Pt is NAD awaiting provider.

## 2018-09-03 NOTE — ED Provider Notes (Signed)
[facility] Emergency Department Provider Note  ____________________________________________   First MD Initiated Contact with Patient 09/03/18 1554     (approximate)  I have reviewed the triage vital signs and the nursing notes.   HISTORY  Chief Complaint Cough    HPI Kaylee Malone is a 43 y.o. female [place] emergency department with flulike symptoms, patient is complained of fever, chills, body aches.  cough, sore throat, vomiting, diarrhea; denies chest pain or sob.  Sx for 1 days   Past Medical History:  Diagnosis Date  . Hypertension     There are no active problems to display for this patient.   Past Surgical History:  Procedure Laterality Date  . CESAREAN SECTION     x3  . TUBAL LIGATION      Prior to Admission medications   Medication Sig Start Date End Date Taking? Authorizing Provider  ondansetron (ZOFRAN-ODT) 4 MG disintegrating tablet Take 1 tablet (4 mg total) by mouth every 8 (eight) hours as needed for nausea or vomiting. 09/03/18   Sherrie Mustache Roselyn Bering, PA-C  oseltamivir (TAMIFLU) 75 MG capsule Take 1 capsule (75 mg total) by mouth 2 (two) times daily. 09/03/18   Faythe Ghee, PA-C    Allergies Codeine  No family history on file.  Social History Social History   Tobacco Use  . Smoking status: Never Smoker  . Smokeless tobacco: Never Used  Substance Use Topics  . Alcohol use: No  . Drug use: No    Review of Systems  Constitutional: Positive fever/chills Eyes: No visual changes. ENT: No sore throat. Respiratory: Positive cough Esther intestinal: Positive vomiting and diarrhea x1 episode each Genitourinary: Negative for dysuria. Musculoskeletal: Negative for back pain. Skin: Negative for rash.    ____________________________________________   PHYSICAL EXAM:  VITAL SIGNS: ED Triage Vitals  Enc Vitals Group     BP 09/03/18 1535 (!) 167/91     Pulse Rate 09/03/18 1535 (!) 110     Resp 09/03/18 1535 20     Temp 09/03/18 1535 100.3 F (37.9 C)     Temp Source 09/03/18 1535 Oral     SpO2 09/03/18 1535 96 %     Weight 09/03/18 1537 198 lb (89.8 kg)     Height 09/03/18 1537 5\' 7"  (1.702 m)     Head Circumference --      Peak Flow --      Pain Score 09/03/18 1537 6     Pain Loc --      Pain Edu? --      Excl. in GC? --     Constitutional: Alert and oriented. Well appearing and in no acute distress. Eyes: Conjunctivae are normal.  Head: Atraumatic. Nose: No congestion/rhinnorhea. Mouth/Throat: Mucous membranes are moist.  Throat appears normal Neck:  supple no lymphadenopathy noted Cardiovascular: Normal rate, regular rhythm. Heart sounds are normal Respiratory: Normal respiratory effort.  No retractions, lungs c t a  Abd: soft nontender bs normal all 4 quad, negative Murphy sign, negative McBurney's point tenderness GU: deferred Musculoskeletal: FROM all extremities, warm and well perfused Neurologic:  Normal speech and language.  Skin:  Skin is warm, dry and intact. No rash noted. Psychiatric: Mood and affect are normal. Speech and behavior are normal.  ____________________________________________   LABS (all labs ordered are listed, but only abnormal results are displayed)  Labs Reviewed - No data to display ____________________________________________   ____________________________________________  RADIOLOGY    ____________________________________________   PROCEDURES  Procedure(s) performed:  No  Procedures    ____________________________________________   INITIAL IMPRESSION / ASSESSMENT AND PLAN / ED COURSE  Pertinent labs & imaging results that were available during my care of the patient were reviewed by me and considered in my medical decision making (see chart for details).   Patient is 43 year old female presents emergency department flulike symptoms  Physical exam shows patient with a low-grade temp.  Remainder of exam is basically  unremarkable  Explained to the patient we are no longer testing healthy adults.  She was diagnosed with flu.  She is to take Tamiflu.  Zofran if needed for nausea.  Tylenol and ibuprofen for fever as needed.  Drink plenty of fluids.  States she understands will comply.  She was discharged in stable condition with a work note.     As part of my medical decision making, I reviewed the following data within the electronic MEDICAL RECORD NUMBER Nursing notes reviewed and incorporated, Old chart reviewed, Notes from prior ED visits and [place] Controlled Substance Database  ____________________________________________   FINAL CLINICAL IMPRESSION(S) / ED DIAGNOSES  Final diagnoses:  Influenza-like illness      NEW MEDICATIONS STARTED DURING THIS VISIT:  Discharge Medication List as of 09/03/2018  4:21 PM    START taking these medications   Details  oseltamivir (TAMIFLU) 75 MG capsule Take 1 capsule (75 mg total) by mouth 2 (two) times daily., Starting Sat 09/03/2018, Normal         Note:  This document was prepared using Dragon voice recognition software and may include unintentional dictation errors.    Faythe Ghee, PA-C 09/03/18 1745    Arnaldo Natal, MD 09/04/18 [phone]

## 2018-10-17 ENCOUNTER — Emergency Department: Payer: Self-pay

## 2018-10-17 ENCOUNTER — Other Ambulatory Visit: Payer: Self-pay

## 2018-10-17 ENCOUNTER — Emergency Department
Admission: EM | Admit: 2018-10-17 | Discharge: 2018-10-17 | Disposition: A | Discharge status: Home / Self Care | Payer: Self-pay | Attending: Emergency Medicine | Admitting: Emergency Medicine

## 2018-10-17 DIAGNOSIS — R002 Palpitations: Secondary | ICD-10-CM | POA: Insufficient documentation

## 2018-10-17 DIAGNOSIS — I10 Essential (primary) hypertension: Secondary | ICD-10-CM | POA: Insufficient documentation

## 2018-10-17 DIAGNOSIS — Z79899 Other long term (current) drug therapy: Secondary | ICD-10-CM | POA: Insufficient documentation

## 2018-10-17 LAB — CBC WITH DIFFERENTIAL/PLATELET
Abs Immature Granulocytes: 0.05 10*3/uL (ref 0.00–0.07)
Basophils Absolute: 0.1 10*3/uL (ref 0.0–0.1)
Basophils Relative: 1 %
Eosinophils Absolute: 0.1 10*3/uL (ref 0.0–0.5)
Eosinophils Relative: 1 %
HCT: 35 % — ABNORMAL LOW (ref 36.0–46.0)
Hemoglobin: 12.4 g/dL (ref 12.0–15.0)
Immature Granulocytes: 0 %
Lymphocytes Relative: 29 %
Lymphs Abs: 3.5 10*3/uL (ref 0.7–4.0)
MCH: 27.9 pg (ref 26.0–34.0)
MCHC: 35.4 g/dL (ref 30.0–36.0)
MCV: 78.7 fL — ABNORMAL LOW (ref 80.0–100.0)
Monocytes Absolute: 1.2 10*3/uL — ABNORMAL HIGH (ref 0.1–1.0)
Monocytes Relative: 9 %
Neutro Abs: 7.2 10*3/uL (ref 1.7–7.7)
Neutrophils Relative %: 60 %
Platelets: 225 10*3/uL (ref 150–400)
RBC: 4.45 MIL/uL (ref 3.87–5.11)
RDW: 14.5 % (ref 11.5–15.5)
WBC: 12.2 10*3/uL — ABNORMAL HIGH (ref 4.0–10.5)
nRBC: 0 % (ref 0.0–0.2)

## 2018-10-17 LAB — COMPREHENSIVE METABOLIC PANEL
ALT: 27 U/L (ref 0–44)
AST: 23 U/L (ref 15–41)
Albumin: 4 g/dL (ref 3.5–5.0)
Alkaline Phosphatase: 58 U/L (ref 38–126)
Anion gap: 9 (ref 5–15)
BUN: 13 mg/dL (ref 6–20)
CO2: 22 mmol/L (ref 22–32)
Calcium: 8.5 mg/dL — ABNORMAL LOW (ref 8.9–10.3)
Chloride: 106 mmol/L (ref 98–111)
Creatinine, Ser: 0.57 mg/dL (ref 0.44–1.00)
GFR calc Af Amer: >60 mL/min (ref >60)
GFR calc non Af Amer: >60 mL/min (ref >60)
Glucose, Bld: 113 mg/dL — ABNORMAL HIGH (ref 70–99)
Potassium: 3.4 mmol/L — ABNORMAL LOW (ref 3.5–5.1)
Sodium: 137 mmol/L (ref 135–145)
Total Bilirubin: 0.7 mg/dL (ref 0.3–1.2)
Total Protein: 7.4 g/dL (ref 6.5–8.1)

## 2018-10-17 LAB — TROPONIN I: Troponin I: <0.03 ng/mL (ref <0.03)

## 2018-10-17 LAB — TSH: TSH: 5.422 u[IU]/mL — ABNORMAL HIGH (ref 0.350–4.500)

## 2018-10-17 NOTE — Discharge Instructions (Addendum)
Please return for any further episodes of rapid heartbeat.  Also return for high fever coughing or shortness of breath.  Please follow-up with your regular doctor in the next week or so.  Have them check your thyroid functions.  Your TSH level was slightly elevated today.  This should not have anything to do with your rapid heartbeat but should be followed up.

## 2018-10-17 NOTE — ED Triage Notes (Signed)
Patient presents to ED for feeling of her heart racing. States the sensation woke her up. Has not taken her blood pressure medicine today because she took Tylenol and wasn't sure if it was okay to take both together. States she also ate some chinese food and thinks it was too much sodium. States nausea when she woke up and then vomited once but "that was from the water I drank earlier."

## 2018-10-17 NOTE — ED Provider Notes (Signed)
[facility] Emergency Department Provider Note   ____________________________________________   First MD Initiated Contact with Patient 10/17/18 [phone]     (approximate)  I have reviewed the triage vital signs and the nursing notes.   HISTORY  Chief Complaint Tachycardia    HPI Kaylee Malone is a 43 y.o. female patient reports she did not eat all day long and then when she got home she had some [place] food she had more than usual.  She woke up in the middle of the night with her heart racing and she vomited.  She feels better now.  She has not had this before.  Patient reports she did not take her blood pressure medicines yesterday as she was not sure that they went with Tylenol but they do.  Explained that to her.         Past Medical History:  Diagnosis Date  . Hypertension     There are no active problems to display for this patient.   Past Surgical History:  Procedure Laterality Date  . CESAREAN SECTION     x3  . TUBAL LIGATION      Prior to Admission medications   Medication Sig Start Date End Date Taking? Authorizing Provider  ondansetron (ZOFRAN-ODT) 4 MG disintegrating tablet Take 1 tablet (4 mg total) by mouth every 8 (eight) hours as needed for nausea or vomiting. 09/03/18   Sherrie Mustache Roselyn Bering, PA-C  oseltamivir (TAMIFLU) 75 MG capsule Take 1 capsule (75 mg total) by mouth 2 (two) times daily. 09/03/18   Faythe Ghee, PA-C    Allergies Codeine  No family history on file.  Social History Social History   Tobacco Use  . Smoking status: Never Smoker  . Smokeless tobacco: Never Used  Substance Use Topics  . Alcohol use: No  . Drug use: No    Review of Systems  Constitutional: No fever/chills Eyes: No visual changes. ENT: No sore throat. Cardiovascular: Denies chest pain. Respiratory: Denies shortness of breath. Gastrointestinal: No abdominal pain.  No nausea, vomited once no longer vomiting..  No diarrhea.  No  constipation. Genitourinary: Negative for dysuria. Musculoskeletal: Negative for back pain. Skin: Negative for rash. Neurological: Negative for headaches, focal weakness  ____________________________________________   PHYSICAL EXAM:  VITAL SIGNS: ED Triage Vitals  Enc Vitals Group     BP 10/17/18 0034 (!) 174/84     Pulse Rate 10/17/18 0034 (!) 102     Resp 10/17/18 0034 18     Temp 10/17/18 0034 99.1 F (37.3 C)     Temp Source 10/17/18 0034 Oral     SpO2 10/17/18 0034 98 %     Weight 10/17/18 0035 195 lb (88.5 kg)     Height 10/17/18 0035 5\' 7"  (1.702 m)     Head Circumference --      Peak Flow --      Pain Score 10/17/18 0034 0     Pain Loc --      Pain Edu? --      Excl. in GC? --     Constitutional: Alert and oriented. Well appearing and in no acute distress. Eyes: Conjunctivae are normal. PER. EOMI. Head: Atraumatic. Nose: No congestion/rhinnorhea. Mouth/Throat: Mucous membranes are moist.  Oropharynx non-erythematous. Neck: No stridor.  Cardiovascular: Normal rate, regular rhythm. Grossly normal heart sounds.  Good peripheral circulation. Respiratory: Normal respiratory effort.  No retractions. Lungs CTAB. Gastrointestinal: Soft and nontender. No distention. No abdominal bruits. No CVA tenderness. Musculoskeletal: No  lower extremity tenderness nor edema.   Neurologic:  Normal speech and language. No gross focal neurologic deficits are appreciated.. Skin:  Skin is warm, dry and intact. No rash noted.   ____________________________________________   LABS (all labs ordered are listed, but only abnormal results are displayed)  Labs Reviewed  CBC WITH DIFFERENTIAL/PLATELET - Abnormal; Notable for the following components:      Result Value   WBC 12.2 (*)    HCT 35.0 (*)    MCV 78.7 (*)    Monocytes Absolute 1.2 (*)    All other components within normal limits  COMPREHENSIVE METABOLIC PANEL - Abnormal; Notable for the following components:   Potassium 3.4  (*)    Glucose, Bld 113 (*)    Calcium 8.5 (*)    All other components within normal limits  TSH - Abnormal; Notable for the following components:   TSH 5.422 (*)    All other components within normal limits  TROPONIN I   ____________________________________________  EKG  KG read and interpreted by me shows sinus rhythm at a rate of 100 normal axis no acute ST-T wave changes ____________________________________________  RADIOLOGY  ED MD interpretation: Chest x-ray read by radiology reviewed by me shows no acute disease  Official radiology report(s): Dg Chest 2 View  Result Date: 10/17/2018 CLINICAL DATA:  43 y/o F; tachycardia, warm sensation in chest, nausea, and vomiting. EXAM: CHEST - 2 VIEW COMPARISON:  01/03/2017 chest radiograph FINDINGS: Stable heart size and mediastinal contours are within normal limits. Both lungs are clear. The visualized skeletal structures are unremarkable. IMPRESSION: No acute pulmonary process identified. Electronically Signed   By: Mitzi HansenLance  Furusawa-Stratton M.D.   On: 10/17/2018 01:08    ____________________________________________   PROCEDURES  Procedure(s) performed (including Critical Care):  Procedures   ____________________________________________   INITIAL IMPRESSION / ASSESSMENT AND PLAN / ED COURSE      Patient's labs chest x-ray and EKG are within normal limits.  Her TSH is slightly high which would go along with borderline hypothyroidism.  This would not explain her tachycardia.  She is feeling well currently.  I will let her go have her follow-up with her regular doctor recheck the thyroid function.             ____________________________________________   FINAL CLINICAL IMPRESSION(S) / ED DIAGNOSES  Final diagnoses:  Palpitations     ED Discharge Orders    None       Note:  This document was prepared using Dragon voice recognition software and may include unintentional dictation errors.    Arnaldo NatalMalinda, Paul  F, MD 10/17/18 [phone]

## 2018-10-17 NOTE — ED Notes (Signed)
Patient transported to X-ray 

## 2018-12-27 ENCOUNTER — Other Ambulatory Visit (HOSPITAL_COMMUNITY): Payer: Self-pay | Admitting: Family Medicine

## 2019-01-01 LAB — GONOCOCCUS CULTURE

## 2019-01-16 ENCOUNTER — Encounter: Payer: Self-pay | Admitting: Physician Assistant

## 2019-04-27 ENCOUNTER — Ambulatory Visit: Payer: Self-pay | Admitting: Family Medicine

## 2019-05-23 IMAGING — CR CHEST - 2 VIEW
2 series · 2 of 2 positions shown · non-contrast
Comparison: 01/03/2017 chest radiograph

CLINICAL DATA: 42 y/o F; tachycardia, warm sensation in chest,
nausea, and vomiting.

EXAM:
CHEST - 2 VIEW

[chest pa]
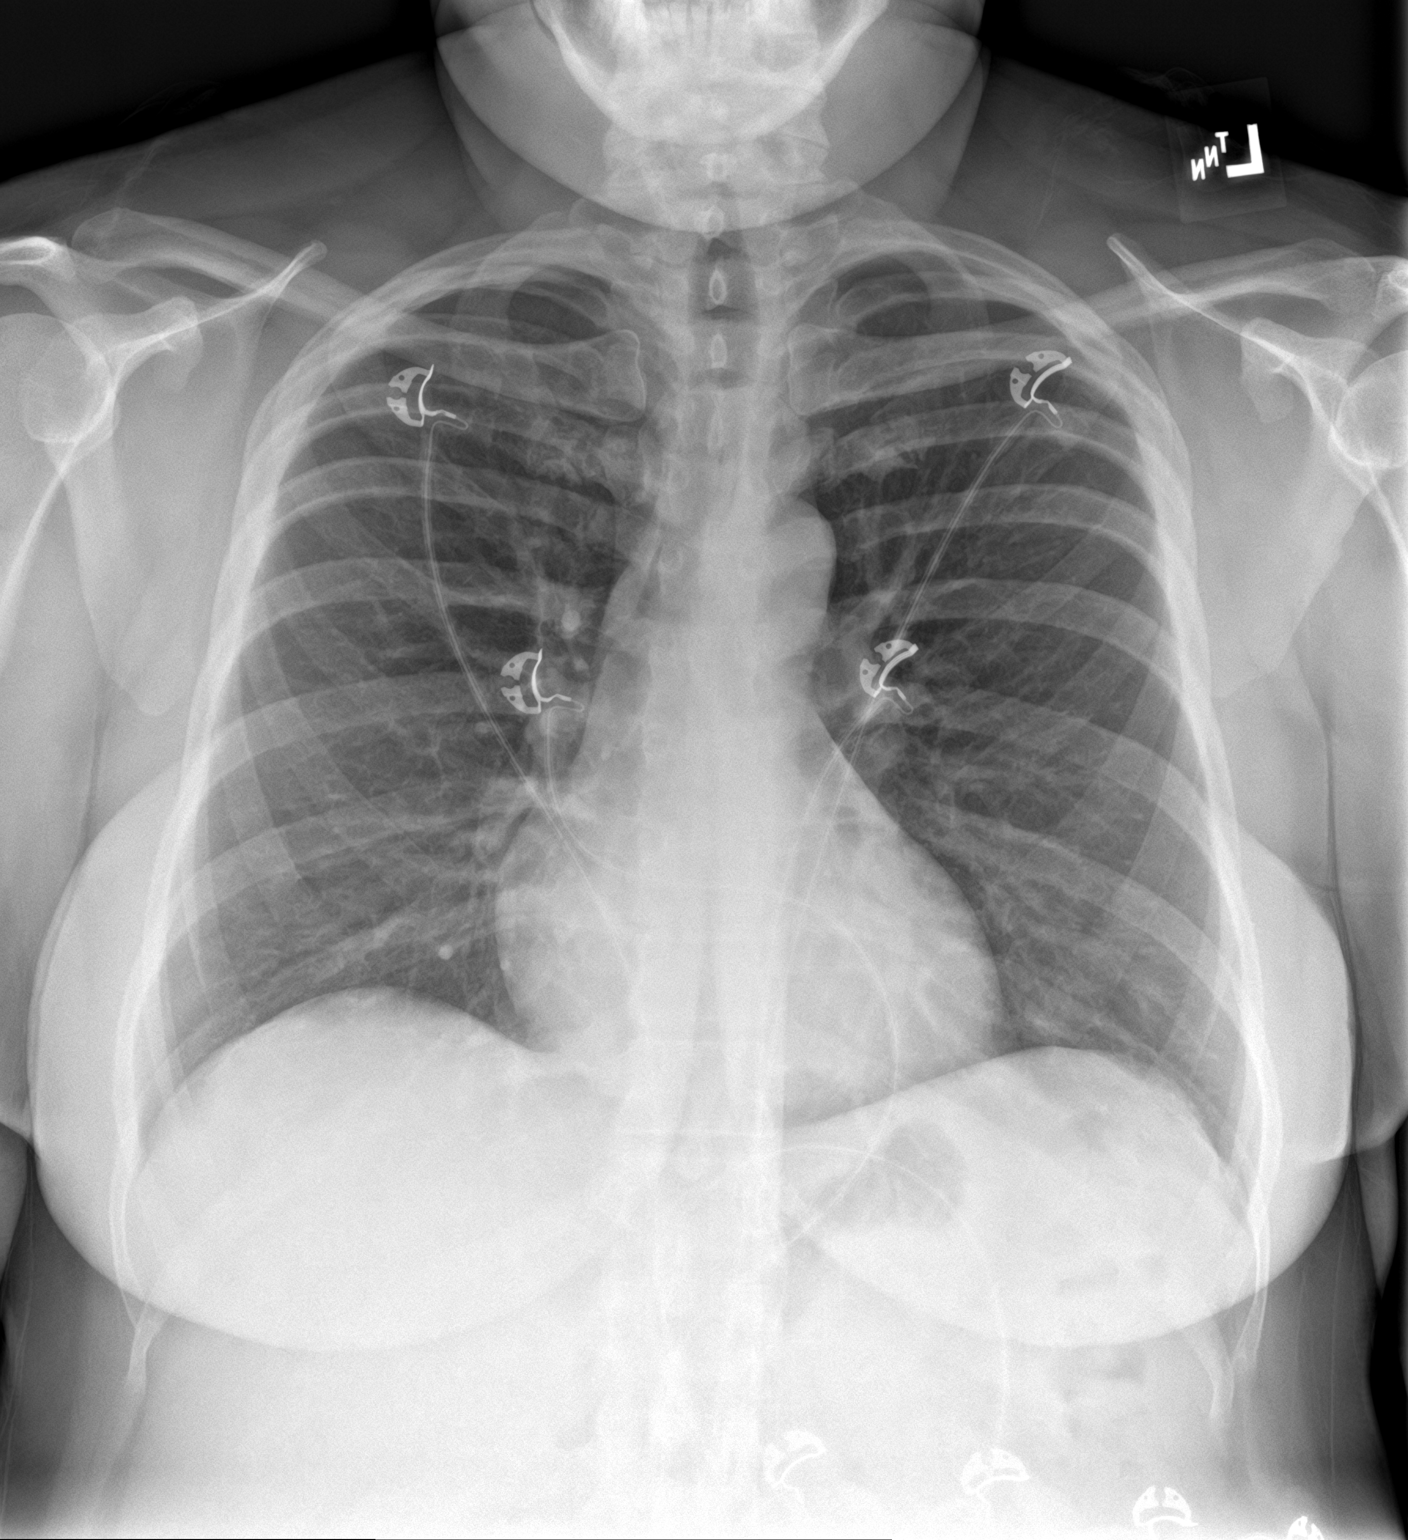

[chest lat]
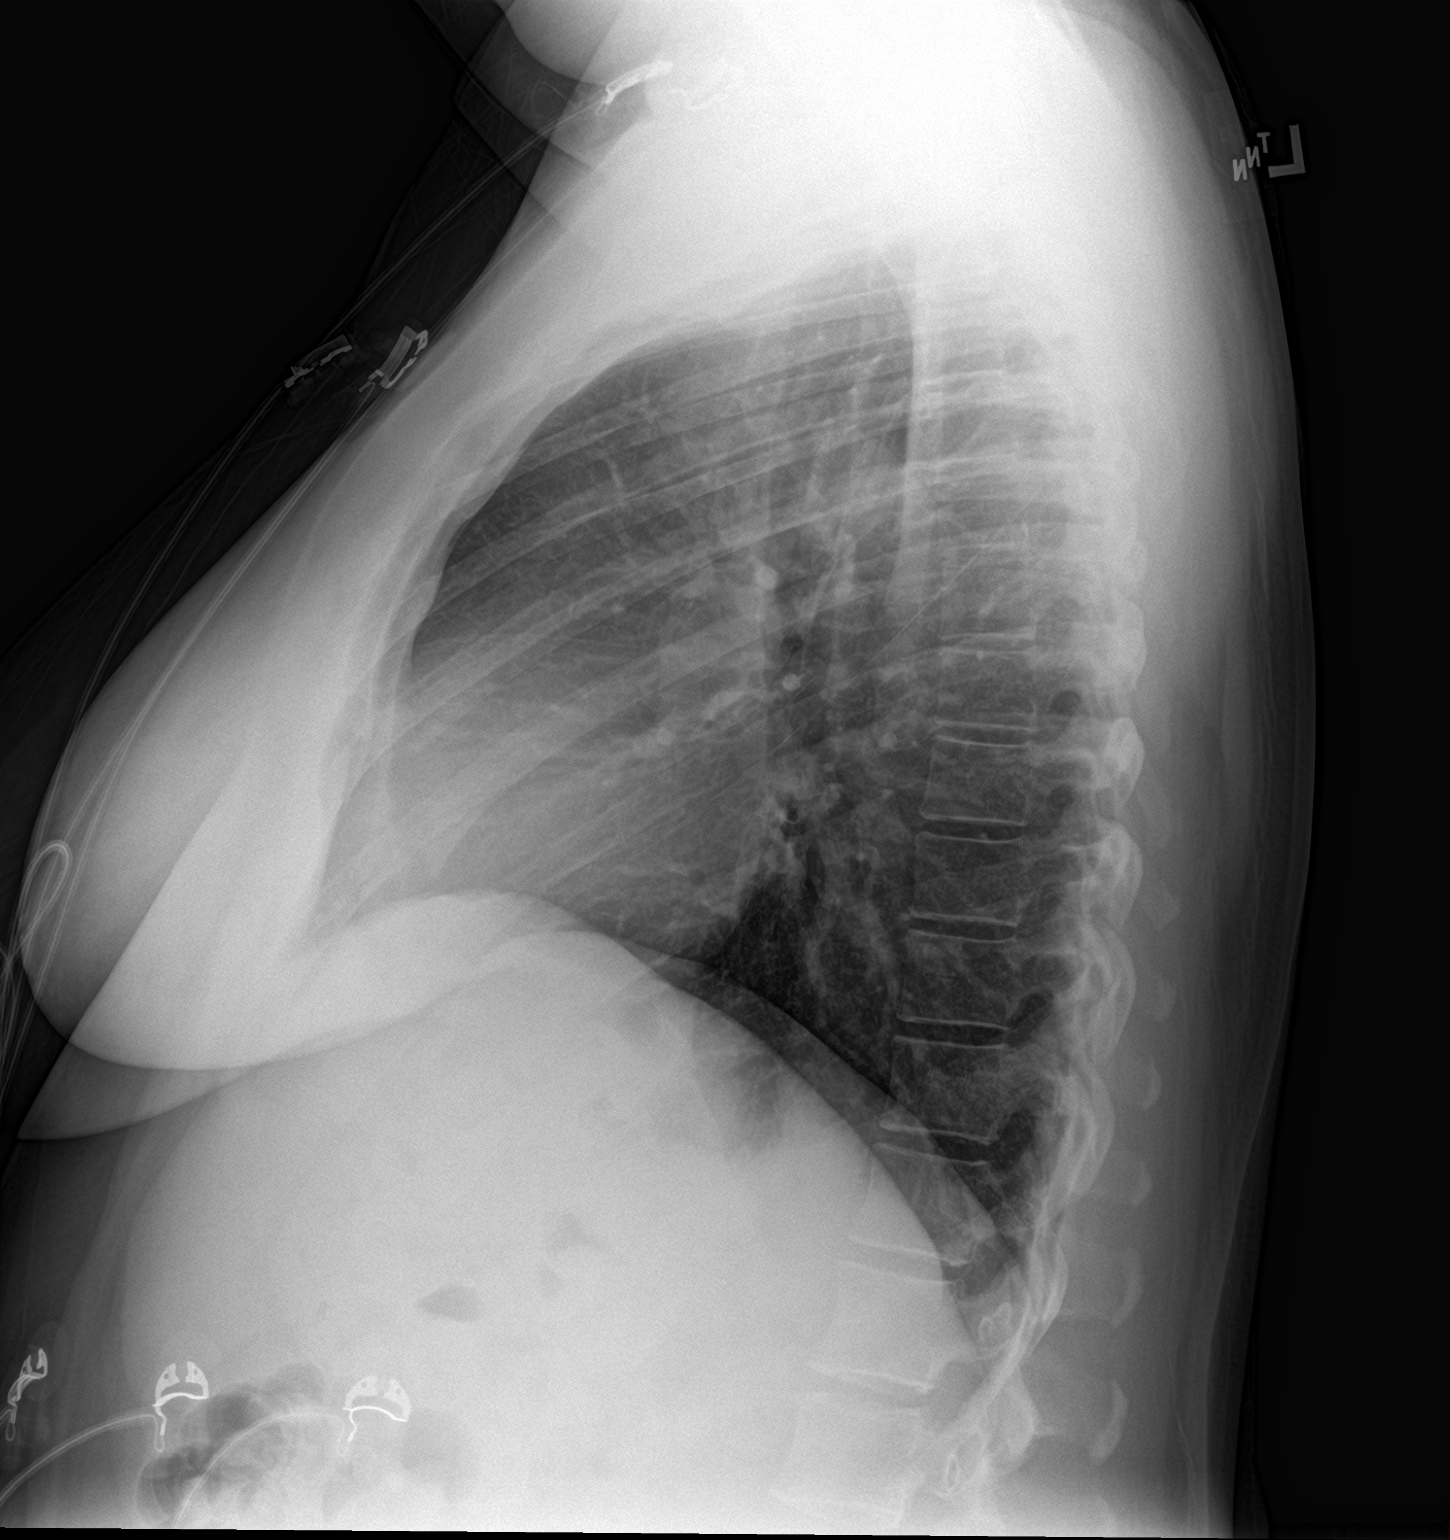

[2 of 2 positions shown; findings below may reference images not displayed]

FINDINGS: Stable heart size and mediastinal contours are within normal limits.
Both lungs are clear. The visualized skeletal structures are
unremarkable.
IMPRESSION: No acute pulmonary process identified.

## 2019-08-23 ENCOUNTER — Other Ambulatory Visit: Payer: Self-pay | Admitting: Physician Assistant

## 2019-08-23 NOTE — Telephone Encounter (Signed)
Rx approved for Acyclovir for episodic treatment with refills x 12.

## 2019-12-14 ENCOUNTER — Ambulatory Visit: Payer: Self-pay

## 2019-12-14 ENCOUNTER — Other Ambulatory Visit: Payer: Self-pay

## 2019-12-15 ENCOUNTER — Ambulatory Visit: Payer: Self-pay | Admitting: Advanced Practice Midwife

## 2019-12-15 ENCOUNTER — Encounter: Payer: Self-pay | Admitting: Advanced Practice Midwife

## 2019-12-15 DIAGNOSIS — Z9851 Tubal ligation status: Secondary | ICD-10-CM

## 2019-12-15 DIAGNOSIS — Z113 Encounter for screening for infections with a predominantly sexual mode of transmission: Secondary | ICD-10-CM

## 2019-12-15 DIAGNOSIS — F339 Major depressive disorder, recurrent, unspecified: Secondary | ICD-10-CM

## 2019-12-15 LAB — WET PREP FOR TRICH, YEAST, CLUE
Trichomonas Exam: POSITIVE — AB
Yeast Exam: NEGATIVE

## 2019-12-15 LAB — HEMOGLOBIN, FINGERSTICK: Hemoglobin: 11.8 g/dL (ref 11.1–15.9)

## 2019-12-15 MED ORDER — METRONIDAZOLE 500 MG PO TABS: 2000.0000 mg | ORAL_TABLET | Freq: Once | ORAL | 0 refills | Status: AC

## 2019-12-15 NOTE — Progress Notes (Signed)
[facility] Department STI clinic/screening visit  Subjective:  Kaylee Malone is a 44 y.o. SBF G3P3 female being seen today for an STI screening visit. The patient reports they do have symptoms.  Patient reports that they do not desire a pregnancy in the next year.   They reported they are not interested in discussing contraception today.  Patient's last menstrual period was 12/09/2019 (exact date).   Patient has the following medical conditions:   Patient Active Problem List   Diagnosis Date Noted   Morbid obesity (HCC) 195 lbs 12/15/2019   History of tubal ligation 2013 12/15/2019   Depression, recurrent (HCC) dx'd 2020 12/15/2019    Chief Complaint  Patient presents with   SEXUALLY TRANSMITTED DISEASE    HPI  Patient reports c/o external itching irritation x1 week.  Last sex 11/08/19 with condom.  Last ETOH last night (1 bottle wine) 1x/mo.  LMP 12/09/19.  BTL 2013  Last HIV test per patient/review of record was unknown Patient reports last pap was unsure   See flowsheet for further details and programmatic requirements.    The following portions of the patient's history were reviewed and updated as appropriate: allergies, current medications, past medical history, past social history, past surgical history and problem list.  Objective:  There were no vitals filed for this visit.  Physical Exam Vitals and nursing note reviewed.  Constitutional:      Appearance: Normal appearance. She is obese.  HENT:     Head: Normocephalic and atraumatic.     Mouth/Throat:     Mouth: Mucous membranes are moist.     Pharynx: Oropharynx is clear. No oropharyngeal exudate or posterior oropharyngeal erythema.  Eyes:     Conjunctiva/sclera: Conjunctivae normal.  Pulmonary:     Effort: Pulmonary effort is normal.  Abdominal:     Palpations: Abdomen is soft. There is no mass.     Tenderness: There is no abdominal tenderness. There is no rebound.     Comments: Soft  without masses or tenderness  Genitourinary:    General: Normal vulva.     Exam position: Lithotomy position.     Pubic Area: No rash or pubic lice.      Labia:        Right: No rash or lesion.        Left: No rash or lesion.      Vagina: Normal. No vaginal discharge (grey frothy leukorrhea, ph>4.5), erythema, bleeding or lesions.     Cervix: Normal.     Uterus: Normal.      Adnexa: Right adnexa normal and left adnexa normal.     Rectum: Normal.  Lymphadenopathy:     Head:     Right side of head: No preauricular or posterior auricular adenopathy.     Left side of head: No preauricular or posterior auricular adenopathy.     Cervical: No cervical adenopathy.     Upper Body:     Right upper body: No supraclavicular or axillary adenopathy.     Left upper body: No supraclavicular or axillary adenopathy.     Lower Body: No right inguinal adenopathy. No left inguinal adenopathy.  Skin:    General: Skin is warm and dry.     Findings: No rash.  Neurological:     Mental Status: She is alert and oriented to person, place, and time.      Assessment and Plan:  Kaylee Malone is a 44 y.o. female presenting to the [facility]  Department for STI screening  1. Morbid obesity (HCC) 198 lbs   2. Screening examination for venereal disease Treat wet mount per standing orders Immunization nurse consult - WET PREP FOR [place], YEAST, CLUE - Hemoglobin, venipuncture - Syphilis Serology, [place] [facility] - HIV [place] [facility] - Chlamydia/Gonorrhea [place] [facility]  3. History of tubal ligation 2013   4. Depression, recurrent ([place]) dx'd 2020      No follow-ups on file.  No future appointments.  Herbie Saxon, CNM

## 2019-12-15 NOTE — Progress Notes (Signed)
Here today for STD screening. Accepts bloodwork. Dub Maclellan, RN ° °

## 2019-12-15 NOTE — Progress Notes (Signed)
[employer] results reviewed. Patient treated for Trich per standing order. Hgb 11.8. Tawny Hopping, RN

## 2019-12-20 LAB — GONOCOCCUS CULTURE

## 2020-07-26 ENCOUNTER — Encounter: Payer: Self-pay | Admitting: Emergency Medicine

## 2020-07-26 ENCOUNTER — Emergency Department
Admission: EM | Admit: 2020-07-26 | Discharge: 2020-07-26 | Disposition: A | Discharge status: Home / Self Care | Payer: No Typology Code available for payment source | Attending: Emergency Medicine | Admitting: Emergency Medicine

## 2020-07-26 ENCOUNTER — Emergency Department: Payer: No Typology Code available for payment source

## 2020-07-26 ENCOUNTER — Other Ambulatory Visit: Payer: Self-pay

## 2020-07-26 DIAGNOSIS — S63630A Sprain of interphalangeal joint of right index finger, initial encounter: Secondary | ICD-10-CM | POA: Insufficient documentation

## 2020-07-26 DIAGNOSIS — I10 Essential (primary) hypertension: Secondary | ICD-10-CM | POA: Diagnosis not present

## 2020-07-26 DIAGNOSIS — Y990 Civilian activity done for income or pay: Secondary | ICD-10-CM | POA: Diagnosis not present

## 2020-07-26 DIAGNOSIS — Y92512 Supermarket, store or market as the place of occurrence of the external cause: Secondary | ICD-10-CM | POA: Insufficient documentation

## 2020-07-26 DIAGNOSIS — W231XXA Caught, crushed, jammed, or pinched between stationary objects, initial encounter: Secondary | ICD-10-CM | POA: Insufficient documentation

## 2020-07-26 DIAGNOSIS — S6991XA Unspecified injury of right wrist, hand and finger(s), initial encounter: Secondary | ICD-10-CM | POA: Diagnosis present

## 2020-07-26 NOTE — ED Triage Notes (Signed)
States she got her right index finger jammed in shopping cart at work today   States this is [facility] and she works at [employer]

## 2020-07-26 NOTE — ED Notes (Signed)
See triage note   Presents with injury to right index finger  States she got her finger got in shopping cart  Min swelling noted

## 2020-07-27 NOTE — ED Provider Notes (Signed)
[facility] Emergency Department Provider Note  ____________________________________________   Event Date/Time   First MD Initiated Contact with Patient 07/26/20 1229     (approximate)  I have reviewed the triage vital signs and the nursing notes.   HISTORY  Chief Complaint Fall and Finger Injury  HPI Kaylee Malone is a 45 y.o. female who presents to the emergency department for evaluation of pain in her right index finger.  The patient states that she jammed her right index finger when trying to separate some shopping carts where she works at [employer] several days ago.  She states that today, she was trying to grip something at work and noted sharp pain in the right index finger and noted that this was not normal for her.  She has not tried any alleviating measures.  Pain is worse with full flexion of the right index finger.         Past Medical History:  Diagnosis Date  . Hypertension     Patient Active Problem List   Diagnosis Date Noted  . Morbid obesity (HCC) 195 lbs 12/15/2019  . History of tubal ligation 2013 12/15/2019  . Depression, recurrent (HCC) dx'd 2020 12/15/2019    Past Surgical History:  Procedure Laterality Date  . CESAREAN SECTION     x3  . TUBAL LIGATION      Prior to Admission medications   Not on File    Allergies Codeine  No family history on file.  Social History Social History   Tobacco Use  . Smoking status: Never Smoker  . Smokeless tobacco: Never Used  Substance Use Topics  . Alcohol use: No  . Drug use: No    Review of Systems Constitutional: No fever/chills Eyes: No visual changes. ENT: No sore throat. Cardiovascular: Denies chest pain. Respiratory: Denies shortness of breath. Gastrointestinal: No abdominal pain.  No nausea, no vomiting.  No diarrhea.  No constipation. Genitourinary: Negative for dysuria. Musculoskeletal: + Right index finger, negative for back pain. Skin: Negative for  rash. Neurological: Negative for headaches, focal weakness or numbness.   ____________________________________________   PHYSICAL EXAM:  VITAL SIGNS: ED Triage Vitals  Enc Vitals Group     BP 07/26/20 1043 (!) 186/95     Pulse Rate 07/26/20 1043 100     Resp 07/26/20 1043 18     Temp 07/26/20 1043 98.2 F (36.8 C)     Temp src --      SpO2 07/26/20 1043 100 %     Weight 07/26/20 1043 195 lb 1.7 oz (88.5 kg)     Height 07/26/20 1043 5\' 7"  (1.702 m)     Head Circumference --      Peak Flow --      Pain Score 07/26/20 1313 3     Pain Loc --      Pain Edu? --      Excl. in GC? --     Constitutional: Alert and oriented. Well appearing and in no acute distress. Eyes: Conjunctivae are normal. PERRL. EOMI. Head: Atraumatic. Nose: No congestion/rhinnorhea. Mouth/Throat: Mucous membranes are moist.  Neck: No stridor.   Musculoskeletal: Right hand reveals no swelling.  Patient has full range of motion of all digits of the right hand.  There is tenderness over the PIP of the right index finger.  No erythema.  Radial pulse 2+.  Capillary refill less than 3 seconds.  Sensation reported normal by the patient. Neurologic:  Normal speech and language. No  gross focal neurologic deficits are appreciated. No gait instability. Skin:  Skin is warm, dry and intact. No rash noted. Psychiatric: Mood and affect are normal. Speech and behavior are normal.  ___________________________________________  RADIOLOGY I, Lucy Chris, personally viewed and evaluated these images (plain radiographs) as part of my medical decision making, as well as reviewing the written report by the radiologist.  ED provider interpretation: No acute fractures of the right hand or index finger  ____________________________________________   INITIAL IMPRESSION / ASSESSMENT AND PLAN / ED COURSE  As part of my medical decision making, I reviewed the following data within the electronic MEDICAL RECORD NUMBER Nursing  notes reviewed and incorporated and Radiograph reviewed         Patient is a 45 year old female who presents to the emergency department for evaluation of pain in her right index finger after getting it jammed at work several days ago and then having pain with gripping today.  See HPI for further details.  On physical exam, the patient has a grossly normal right hand with mild tenderness to palpation at the PIP joint.  X-ray reveals no acute fractures.  Recommended to the patient that she could buddy tape the index and middle finger together for some support for mild sprain to the PIP of the right index finger.  Patient also recommended to take Tylenol and ibuprofen as needed for symptoms.  Patient is amenable with this plan.  She will follow-up with Ortho if she has any lingering problems from this.  She will return for any acute worsening.  Patient stable this time for outpatient therapy.      ____________________________________________   FINAL CLINICAL IMPRESSION(S) / ED DIAGNOSES  Final diagnoses:  Sprain of interphalangeal joint of right index finger, initial encounter     ED Discharge Orders    None      *Please note:  [name] was evaluated in Emergency Department on 07/27/2020 for the symptoms described in the history of present illness. She was evaluated in the context of the global COVID-19 pandemic, which necessitated consideration that the patient might be at risk for infection with the SARS-CoV-2 virus that causes COVID-19. Institutional protocols and algorithms that pertain to the evaluation of patients at risk for COVID-19 are in a state of rapid change based on information released by regulatory bodies including the CDC and federal and state organizations. These policies and algorithms were followed during the patient's care in the ED.  Some ED evaluations and interventions may be delayed as a result of limited staffing during and the pandemic.*   Note:  This  document was prepared using Dragon voice recognition software and may include unintentional dictation errors.   Lucy Chris, PA 07/27/20 2103    Dionne Bucy, MD 07/27/20 [phone]

## 2020-10-06 ENCOUNTER — Other Ambulatory Visit: Payer: Self-pay | Admitting: Physician Assistant

## 2020-10-09 NOTE — Telephone Encounter (Signed)
Call to patient on 10/09/2020, patient states that she does need refills of medication to use for episodic treatment when needed.  Will OK refills for 1 year as needed.

## 2022-04-21 ENCOUNTER — Ambulatory Visit: Payer: Self-pay

## 2022-06-04 ENCOUNTER — Ambulatory Visit: Payer: Self-pay | Admitting: Nurse Practitioner

## 2022-06-04 DIAGNOSIS — B009 Herpesviral infection, unspecified: Secondary | ICD-10-CM | POA: Insufficient documentation

## 2022-06-04 DIAGNOSIS — Z113 Encounter for screening for infections with a predominantly sexual mode of transmission: Secondary | ICD-10-CM

## 2022-06-04 LAB — HM HIV SCREENING LAB: HM HIV Screening: NEGATIVE

## 2022-06-04 LAB — HM HEPATITIS C SCREENING LAB: HM Hepatitis Screen: NEGATIVE

## 2022-06-04 LAB — WET PREP FOR TRICH, YEAST, CLUE
Trichomonas Exam: NEGATIVE
Yeast Exam: NEGATIVE

## 2022-06-04 MED ORDER — ACYCLOVIR 800 MG PO TABS: 800.0000 mg | ORAL_TABLET | Freq: Two times a day (BID) | ORAL | 11 refills | Status: DC

## 2022-06-04 NOTE — Progress Notes (Addendum)
Pt. seen for routine STI screening. Wet mount results reviewed with pt. Condoms declined, no tx indicated.  

## 2022-06-04 NOTE — Progress Notes (Signed)
[facility] Department  STI clinic/screening visit [address] [place] [address] [phone]  Subjective:  Kaylee Malone is a 46 y.o. female being seen today for an STI screening visit. The patient reports they do have symptoms.  Patient reports that they do not desire a pregnancy in the next year.   They reported they are not interested in discussing contraception today.    Patient's last menstrual period was 05/12/2022 (exact date).   Patient has the following medical conditions:   Patient Active Problem List   Diagnosis Date Noted   HSV infection 06/04/2022   Morbid obesity (HCC) 195 lbs 12/15/2019   History of tubal ligation 2013 12/15/2019   Depression, recurrent (HCC) dx'd 2020 12/15/2019    Chief Complaint  Patient presents with   SEXUALLY TRANSMITTED DISEASE    Pt. Stated she needs "a new prescription for herpes medication."     HPI  Patient reports to clinic today for STD screening.  Patient reports frequent urination that has been present for 2 days.    Does the patient using douching products? No  Last HIV test per patient/review of record was: 2021 Patient reports last pap was: greater than 2 years ago   Screening for MPX risk: Does the patient have an unexplained rash? No Is the patient MSM? No Does the patient endorse multiple sex partners or anonymous sex partners? No Did the patient have close or sexual contact with a person diagnosed with MPX? No Has the patient traveled outside the [place] where MPX is endemic? No Is there a high clinical suspicion for MPX-- evidenced by one of the following No  -Unlikely to be chickenpox  -Lymphadenopathy  -Rash that present in same phase of evolution on any given body part See flowsheet for further details and programmatic requirements.   Immunization history:  Immunization History  Administered Date(s) Administered   Hep A / Hep B 04/26/2007, 07/19/2007   Tdap 04/26/2007     The  following portions of the patient's history were reviewed and updated as appropriate: allergies, current medications, past medical history, past social history, past surgical history and problem list.  Objective:  There were no vitals filed for this visit.  Physical Exam Constitutional:      Appearance: Normal appearance.  HENT:     Head: Normocephalic. No abrasion, masses or laceration. Hair is normal.     Right Ear: External ear normal.     Left Ear: External ear normal.     Nose: Nose normal.     Mouth/Throat:     Lips: Pink.     Mouth: Mucous membranes are moist. No oral lesions.     Pharynx: No oropharyngeal exudate or posterior oropharyngeal erythema.     Tonsils: No tonsillar exudate or tonsillar abscesses.  Eyes:     General: Lids are normal.        Right eye: No discharge.        Left eye: No discharge.     Conjunctiva/sclera: Conjunctivae normal.     Right eye: No exudate.    Left eye: No exudate. Abdominal:     General: Abdomen is flat.     Palpations: Abdomen is soft.     Tenderness: There is no abdominal tenderness. There is no rebound.  Genitourinary:    Pubic Area: No rash or pubic lice.      Labia:        Right: No rash, tenderness, lesion or injury.  Left: No rash, tenderness, lesion or injury.      Vagina: Normal. No vaginal discharge, erythema or lesions.     Cervix: No cervical motion tenderness, discharge, lesion or erythema.     Uterus: Not enlarged and not tender.      Rectum: Normal.     Comments: Amount Discharge: small  Odor: No pH: less than 4.5 Adheres to vaginal wall: No Color: [place]  Musculoskeletal:     Cervical back: Full passive range of motion without pain, normal range of motion and neck supple.  Lymphadenopathy:     Cervical: No cervical adenopathy.     Right cervical: No superficial, deep or posterior cervical adenopathy.    Left cervical: No superficial, deep or posterior cervical adenopathy.     Upper Body:     Right upper  body: No supraclavicular, axillary or epitrochlear adenopathy.     Left upper body: No supraclavicular, axillary or epitrochlear adenopathy.     Lower Body: No right inguinal adenopathy. No left inguinal adenopathy.  Skin:    General: Skin is warm and dry.     Findings: No lesion or rash.  Neurological:     Mental Status: She is alert and oriented to person, place, and time.  Psychiatric:        Attention and Perception: Attention normal.        Mood and Affect: Mood normal.        Speech: Speech normal.        Behavior: Behavior normal. Behavior is cooperative.      Assessment and Plan:  Kaylee Malone is a 46 y.o. female presenting to the [facility] Department for STI screening  1. Screening examination for venereal disease -46 year old female in clinic today for STD screening. -Patient accepted all screenings including oral, vaginal CT/GC, wet prep and bloodwork for HIV/RPR.  Patient meets criteria for HepB screening? No. Ordered? No - low risk  Patient meets criteria for HepC screening? No. Ordered? No - low risk   Treat wet prep per standing order Discussed time line for State Lab results and that patient will be called with positive results and encouraged patient to call if she had not heard in 2 weeks.  Counseled to return or seek care for continued or worsening symptoms Recommended condom use with all sex  Patient is currently using [facility] to prevent pregnancy.    - HIV [place] [facility] - Syphilis Serology, [place] [facility] - Chlamydia/Gonorrhea [place] [facility] - Chlamydia/Gonorrhea [place] [facility] - WET PREP FOR TRICH, YEAST, CLUE   2. HSV infection -Patient with a history of HSV.  Patient desires to take medication episodically. Prescription submitted for Acyclovir 800 MG BID x 5 days.   - acyclovir (ZOVIRAX) 800 MG tablet; Take 1 tablet (800 mg total) by mouth 2 (two) times daily.  Dispense: 10 tablet; Refill: 11   Total time spent: 30  minutes    Return if symptoms worsen or fail to improve.    Glenna Fellows, FNP

## 2022-06-04 NOTE — Progress Notes (Signed)
Pt appointment for STI screening and prescription refill. Seen by FNP White. Initial results all negative and reviewed with pt.

## 2022-08-13 ENCOUNTER — Ambulatory Visit (LOCAL_COMMUNITY_HEALTH_CENTER): Payer: Self-pay

## 2022-08-13 VITALS — BP 210/100 | Ht 67.0 in | Wt 196.5 lb

## 2022-08-13 DIAGNOSIS — Z3202 Encounter for pregnancy test, result negative: Secondary | ICD-10-CM

## 2022-08-13 LAB — PREGNANCY, URINE: Preg Test, Ur: NEGATIVE

## 2022-08-13 NOTE — Progress Notes (Signed)
UPT negative. Stated period was 8 days late. LMP 06/10/23. No PCP . BP assessed 204/94, rechecked manually 210/100 and 208/98.Pt reports hx of HTN,but stopped meds a couple of years ago. Pt denies hx of diabetes,no complaints of HA,visual changes, denies chest pain.,no other s/sx.Consulted w/ Provider Ralene Bathe Streilein,PA d/t elevated BP .  Pt reports night sweats and PMH of anxiety-no on any meds.  Consulted w/ provider A.Streilein,PA followeod (hypertensive Urgency  and/or Emergency Crisis Standing orders)  D/t systolic BP >920 requires same day evaluation by a provider,ideally PCP. Pt does not have PCP.Pt advised to go to urgent care.Counseled on risk of organ damage,stroke. Provided resource list for PCP,to establish care.Pt states she was going to [facility] for BP -2 yrs ago,but stopped taking meds d/t bp improving. Pt states [facility] is too far for her to travel . Encouraged pt once addressed elevated BP about scheduling annual exam.Pt was last seen 06/04/22 for STI screening. Pt has hx of tubal ligation and has not had annual exam since that time. Pt agrees to go for MD eval today.

## 2023-06-28 ENCOUNTER — Other Ambulatory Visit: Payer: Self-pay | Admitting: Nurse Practitioner

## 2023-06-28 DIAGNOSIS — B009 Herpesviral infection, unspecified: Secondary | ICD-10-CM

## 2023-07-06 ENCOUNTER — Other Ambulatory Visit: Payer: Self-pay | Admitting: Family Medicine

## 2023-07-06 ENCOUNTER — Telehealth: Payer: Self-pay

## 2023-07-06 DIAGNOSIS — B009 Herpesviral infection, unspecified: Secondary | ICD-10-CM

## 2023-07-06 MED ORDER — ACYCLOVIR 800 MG PO TABS: 800.0000 mg | ORAL_TABLET | Freq: Two times a day (BID) | ORAL | 1 refills | Status: DC

## 2023-07-06 NOTE — Telephone Encounter (Signed)
Pt notified that a prescription for Acyclovir was sent to [facility] ([address] however, she will need to schedule an appointment to receive any additional refills.  Pt verbalizes understanding.  Berdie Ogren, RN

## 2023-08-27 ENCOUNTER — Other Ambulatory Visit: Payer: Self-pay | Admitting: Family Medicine

## 2023-08-27 DIAGNOSIS — B009 Herpesviral infection, unspecified: Secondary | ICD-10-CM

## 2023-09-23 ENCOUNTER — Encounter: Payer: Self-pay | Admitting: Nurse Practitioner

## 2023-09-25 ENCOUNTER — Emergency Department: Payer: Self-pay

## 2023-09-25 ENCOUNTER — Other Ambulatory Visit: Payer: Self-pay

## 2023-09-25 ENCOUNTER — Encounter: Payer: Self-pay | Admitting: Emergency Medicine

## 2023-09-25 ENCOUNTER — Emergency Department
Admission: EM | Admit: 2023-09-25 | Discharge: 2023-09-26 | Disposition: A | Discharge status: Home / Self Care | Payer: Self-pay | Attending: Emergency Medicine | Admitting: Emergency Medicine

## 2023-09-25 DIAGNOSIS — I10 Essential (primary) hypertension: Secondary | ICD-10-CM | POA: Insufficient documentation

## 2023-09-25 DIAGNOSIS — D649 Anemia, unspecified: Secondary | ICD-10-CM | POA: Insufficient documentation

## 2023-09-25 LAB — BASIC METABOLIC PANEL
Anion gap: 10 (ref 5–15)
BUN: 9 mg/dL (ref 6–20)
CO2: 21 mmol/L — ABNORMAL LOW (ref 22–32)
Calcium: 9.4 mg/dL (ref 8.9–10.3)
Chloride: 104 mmol/L (ref 98–111)
Creatinine, Ser: 0.63 mg/dL (ref 0.44–1.00)
GFR, Estimated: >60 mL/min (ref >60)
Glucose, Bld: 171 mg/dL — ABNORMAL HIGH (ref 70–99)
Potassium: 3.5 mmol/L (ref 3.5–5.1)
Sodium: 135 mmol/L (ref 135–145)

## 2023-09-25 LAB — TROPONIN I (HIGH SENSITIVITY): Troponin I (High Sensitivity): 9 ng/L (ref <18)

## 2023-09-25 LAB — CBC
HCT: 28.2 % — ABNORMAL LOW (ref 36.0–46.0)
Hemoglobin: 8.2 g/dL — ABNORMAL LOW (ref 12.0–15.0)
MCH: 17.1 pg — ABNORMAL LOW (ref 26.0–34.0)
MCHC: 29.1 g/dL — ABNORMAL LOW (ref 30.0–36.0)
MCV: 58.8 fL — ABNORMAL LOW (ref 80.0–100.0)
Platelets: 291 10*3/uL (ref 150–400)
RBC: 4.8 MIL/uL (ref 3.87–5.11)
RDW: 20.9 % — ABNORMAL HIGH (ref 11.5–15.5)
WBC: 12.9 10*3/uL — ABNORMAL HIGH (ref 4.0–10.5)
nRBC: 0 % (ref 0.0–0.2)

## 2023-09-25 LAB — HCG, QUANTITATIVE, PREGNANCY: hCG, Beta Chain, Quant, S: 1 m[IU]/mL (ref <5)

## 2023-09-25 MED ORDER — LABETALOL HCL 5 MG/ML IV SOLN
20.0000 mg | Freq: Once | INTRAVENOUS | Status: AC
  Administered 2023-09-25: 20 mg via INTRAVENOUS
  Filled 2023-09-25: qty 4

## 2023-09-25 MED ORDER — FERROUS SULFATE 325 (65 FE) MG PO TBEC: 325.0000 mg | DELAYED_RELEASE_TABLET | Freq: Two times a day (BID) | ORAL | 0 refills | Status: AC

## 2023-09-25 MED ORDER — AMLODIPINE BESYLATE 5 MG PO TABS
5.0000 mg | ORAL_TABLET | Freq: Once | ORAL | Status: AC
  Administered 2023-09-25: 5 mg via ORAL
  Filled 2023-09-25: qty 1

## 2023-09-25 MED ORDER — AMLODIPINE BESYLATE 5 MG PO TABS: 5.0000 mg | ORAL_TABLET | Freq: Every day | ORAL | 2 refills | Status: DC

## 2023-09-25 NOTE — ED Triage Notes (Signed)
 Patient C/O hypertension and chest discomfort that began today. Patient states she has a history of HTN but was able to come off of medication. Patient states her highest value at home was 205/111.

## 2023-09-25 NOTE — ED Provider Notes (Signed)
 [facility] Provider Note    None    (approximate)   History   Hypertension and Chest Pain   HPI  Kaylee Malone is a 48 y.o. female who has a history of hypertension   Patient reports previous history of hypertension treated with amlodipine.  She since discontinued it for quite some time, she reports significant stress in her life and has started noticed the chest discomfort over the last 48 hours.  This afternoon it started to feel like it was radiating towards her left arm with a tingly feeling.  This prompted her check her blood pressure and it was extremely high at her house.  She is not currently on any medications denies pregnancy  No headache no numbness or weakness except when the pain in her chest was present was tingling somewhat in her left hand that has resolved.  No fevers or chills no vomiting no blurred vision  Physical Exam   Triage Vital Signs: ED Triage Vitals  Encounter Vitals Group     BP 09/25/23 2104 (!) 197/97     Systolic BP Percentile --      Diastolic BP Percentile --      Pulse Rate 09/25/23 2104 (!) 118     Resp 09/25/23 2104 20     Temp 09/25/23 2104 98.7 F (37.1 C)     Temp Source 09/25/23 2104 Oral     SpO2 09/25/23 2104 100 %     Weight 09/25/23 2107 208 lb 1.8 oz (94.4 kg)     Height 09/25/23 2107 5\' 7"  (1.702 m)     Head Circumference --      Peak Flow --      Pain Score --      Pain Loc --      Pain Education --      Exclude from Growth Chart --     Most recent vital signs: Vitals:   09/25/23 2252 09/25/23 2300  BP: (!) 179/88 (!) 171/81  Pulse: 94 93  Resp: 17 17  Temp:    SpO2: 100% 100%     General: Awake, no distress.  Very pleasant fully alert and oriented grossly normal cranial nerve exam.  Extraocular movements are normal.  Fully alert and oriented currently voicing no pain or discomfort CV:  Good peripheral perfusion.  Normal heart tones.  Blood pressure 202 systolic with a MAP of  approximately 130 at this time Resp:  Normal effort.  Clear bilateral Abd:  No distention.  Other:  No lower extremity edema   ED Results / Procedures / Treatments   Labs (all labs ordered are listed, but only abnormal results are displayed) Labs Reviewed  BASIC METABOLIC PANEL - Abnormal; Notable for the following components:      Result Value   CO2 21 (*)    Glucose, Bld 171 (*)    All other components within normal limits  CBC - Abnormal; Notable for the following components:   WBC 12.9 (*)    Hemoglobin 8.2 (*)    HCT 28.2 (*)    MCV 58.8 (*)    MCH 17.1 (*)    MCHC 29.1 (*)    RDW 20.9 (*)    All other components within normal limits  HCG, QUANTITATIVE, PREGNANCY  POC URINE PREG, ED  TROPONIN I (HIGH SENSITIVITY)  TROPONIN I (HIGH SENSITIVITY)     EKG  And interpreted by me at 2120 heart rate 110 QRS 80 QTc 460 Sinus  tachycardia.  No evidence of acute ischemia   RADIOLOGY  DG Chest 2 View Result Date: 09/25/2023 CLINICAL DATA:  Chest pain. Hypertension and chest discomfort beginning today. EXAM: CHEST - 2 VIEW COMPARISON:  10/17/2018 FINDINGS: Normal heart size and pulmonary vascularity. No focal airspace disease or consolidation in the lungs. No blunting of costophrenic angles. No pneumothorax. Mediastinal contours appear intact. IMPRESSION: No active cardiopulmonary disease. Electronically Signed   By: Burman Nieves M.D.   On: 09/25/2023 22:21      PROCEDURES:  Critical Care performed: No  Procedures   MEDICATIONS ORDERED IN ED: Medications  labetalol (NORMODYNE) injection 20 mg (20 mg Intravenous Given 09/25/23 2234)  amLODipine (NORVASC) tablet 5 mg (5 mg Oral Given 09/25/23 2230)     IMPRESSION / MDM / ASSESSMENT AND PLAN / ED COURSE  I reviewed the triage vital signs and the nursing notes.                              Differential diagnosis includes, but is not limited to, hypertensive urgency, borderline hypertensive emergency, ACS,  symptomatic anemia etc.  Based on the clinical history of previous history and her severely elevated blood pressure I suspect hypertensive urgency.  Her initial troponin and ECG did not show evidence of ischemia.  She has no central neurologic symptoms except she did relate a paresthesia in the left hand associated with chest pressure radiating to that extremity, doubtful for evidence to suggest acute stroke or hemorrhage.  She is currently awake alert  Will treat with IV labetalol given severity of hypertension currently with a MAP of 132.  Also give her previously effective amlodipine and monitor for improvement in ongoing symptomatology.  Patient's presentation is most consistent with acute complicated illness / injury requiring diagnostic workup.  ----------------------------------------- 11:46 PM on 09/25/2023 ----------------------------------------- Ongoing care assigned to Dr. Rosalia Hammers.  Patient resting comfortably asymptomatic at this time blood pressure is improved to 170s over 80.  Current plan is to follow-up on repeat troponin, given the patient's asymptomatic status and history of hypertension I would be comfortable thereafter discharging.  Discussed with the patient careful return precautions need to establish and follow-up with PCP which she is agreeable with.  Have given her additional options for this and placed referral as well.  She is comfortable with plan for prescription for amlodipine which she used to take, careful return precautions around signs and symptoms of hypertensive urgency or emergency, as well as will provide prescription for iron as I am suspicious she may have a condition of low iron though do not have lab test results/show this at this time but think this would be appropriate for outpatient.  No black no bloody stools no history of bleeding aside from her menstrual cycles      The patient is also noted to have a notable microcytic anemia.  She reports intermittently  that she will have somewhat heavy menstrual cycles currently not on her menstrual cycle.  She is not having any shortness of breath lightheadedness or symptoms of be highly suggestive of acute blood loss either.  Based on the low MCV I would wager this is likely related to an iron deficiency though this testing has not been confirmed.  Discussed with the patient she is agreeable to follow-up outpatient with this we did discuss concern for anemia     FINAL CLINICAL IMPRESSION(S) / ED DIAGNOSES   Final diagnoses:  Uncontrolled hypertension  Rx / DC Orders   ED Discharge Orders          Ordered    Ambulatory Referral to Primary Care (Establish Care)       Comments: Hypertension and anemia   09/25/23 2305    amLODipine (NORVASC) 5 MG tablet  Daily        09/25/23 2332    ferrous sulfate 325 (65 FE) MG EC tablet  2 times daily with meals        09/25/23 2332             Note:  This document was prepared using Dragon voice recognition software and may include unintentional dictation errors.   Sharyn Creamer, MD 09/25/23 [phone]

## 2023-09-25 NOTE — Discharge Instructions (Addendum)
 Take your medication as prescribed.  Keep a log of your blood pressures and take this to your primary care appointment.  Return to the ER for new or worsening symptoms.

## 2023-09-26 LAB — TROPONIN I (HIGH SENSITIVITY): Troponin I (High Sensitivity): 11 ng/L (ref <18)

## 2023-09-26 NOTE — ED Provider Notes (Signed)
 Care of this patient assumed from prior physician at 2300 pending repeat troponin, reevaluation of blood pressure and disposition. Please see prior physician note for further details.  Briefly this is a 48 year old female with history of hypertension previously on limitation presenting to the emergency department for evaluation of chest discomfort, found to be hypertensive on presentation.  She was treated with IV labetalol with improvement in her blood pressure.  Initial blood work notable for anemia, but no acute bleeding sources, does have history of recent heavy periods but not actively menstruating.  Initial troponin negative.  Signed out to me pending repeat troponin, anticipated discharge if this is normal and blood pressure remained improved.  12:32 AM Repeat troponin returned within normal limits.  Blood pressure remains improved with systolics in the 150s.  Patient reassessed.  Feels improved.  No new complaints.  Discussed results of workup.  She is comfortable discharge home.  Prescription for amlodipine sent by prior provider.  Strict return precautions provided.  Patient discharged stable condition.   Trinna Post, MD 09/26/23 [phone]

## 2023-09-28 ENCOUNTER — Telehealth: Payer: Self-pay

## 2023-09-28 ENCOUNTER — Ambulatory Visit: Payer: Self-pay

## 2023-09-28 NOTE — Telephone Encounter (Signed)
 Received ED referral. Called pt to give info on clinic. Vmail unavailable.

## 2023-10-01 ENCOUNTER — Ambulatory Visit: Payer: Self-pay

## 2023-10-04 ENCOUNTER — Ambulatory Visit: Payer: Self-pay

## 2023-10-11 ENCOUNTER — Other Ambulatory Visit: Payer: Self-pay

## 2023-10-11 ENCOUNTER — Emergency Department: Payer: Self-pay

## 2023-10-11 ENCOUNTER — Emergency Department
Admission: EM | Admit: 2023-10-11 | Discharge: 2023-10-12 | Disposition: A | Discharge status: Home / Self Care | Payer: Self-pay | Attending: Emergency Medicine | Admitting: Emergency Medicine

## 2023-10-11 DIAGNOSIS — I10 Essential (primary) hypertension: Secondary | ICD-10-CM | POA: Insufficient documentation

## 2023-10-11 DIAGNOSIS — Z79899 Other long term (current) drug therapy: Secondary | ICD-10-CM | POA: Insufficient documentation

## 2023-10-11 DIAGNOSIS — B349 Viral infection, unspecified: Secondary | ICD-10-CM | POA: Insufficient documentation

## 2023-10-11 LAB — RESP PANEL BY RT-PCR (RSV, FLU A&B, COVID)  RVPGX2
Influenza A by PCR: NEGATIVE
Influenza B by PCR: NEGATIVE
Resp Syncytial Virus by PCR: NEGATIVE
SARS Coronavirus 2 by RT PCR: NEGATIVE

## 2023-10-11 MED ORDER — SODIUM CHLORIDE 0.9 % IV BOLUS
1000.0000 mL | Freq: Once | INTRAVENOUS | Status: AC
  Administered 2023-10-12: 1000 mL via INTRAVENOUS

## 2023-10-11 NOTE — ED Provider Notes (Signed)
 [facility] Provider Note    Event Date/Time   First MD Initiated Contact with Patient 10/11/23 2303     (approximate)   History   Nasal Congestion and Hypertension   HPI  Kaylee Malone is a 48 y.o. female who presents to the ED from home with a chief complaint of elevated blood pressure.  Also endorses a 1 day history of fever, dry cough, congestion and generalized malaise.  Patient with a history of hypertension, she was without medicines for quite some time until she was seen in the ED last week for similar complaints and started on amlodipine which she reports compliance with.  Denies chest pain, shortness of breath, vision changes, headache, neck pain, abdominal pain, nausea, vomiting or diarrhea.  Denies recent travel.     Past Medical History   Past Medical History:  Diagnosis Date   Hypertension      Active Problem List   Patient Active Problem List   Diagnosis Date Noted   HSV infection 06/04/2022   Morbid obesity (HCC) 195 lbs 12/15/2019   History of tubal ligation 2013 12/15/2019   Depression, recurrent (HCC) dx'd 2020 12/15/2019     Past Surgical History   Past Surgical History:  Procedure Laterality Date   CESAREAN SECTION     x3   TUBAL LIGATION       Home Medications   Prior to Admission medications   Medication Sig Start Date End Date Taking? Authorizing Provider  chlorpheniramine-HYDROcodone (TUSSIONEX) 10-8 MG/5ML Take 5 mLs by mouth every 12 (twelve) hours as needed for cough. 10/12/23  Yes Irean Hong, MD  acyclovir (ZOVIRAX) 800 MG tablet TAKE 1 TABLET BY MOUTH THREE TIMES DAILY AS NEEDED FOR 2 DAYS Patient not taking: Reported on 06/04/2022 10/09/20   Matt Holmes, PA  acyclovir (ZOVIRAX) 800 MG tablet Take 1 tablet (800 mg total) by mouth 2 (two) times daily. 07/06/23   Lenice Llamas, FNP  amLODipine (NORVASC) 5 MG tablet Take 1 tablet (5 mg total) by mouth daily. 09/25/23 09/24/24  Sharyn Creamer, MD  ferrous  sulfate 325 (65 FE) MG EC tablet Take 1 tablet (325 mg total) by mouth 2 (two) times daily with a meal. 09/25/23 10/25/23  Sharyn Creamer, MD     Allergies  Codeine   Family History  History reviewed. No pertinent family history.   Physical Exam  Triage Vital Signs: ED Triage Vitals  Encounter Vitals Group     BP 10/11/23 2033 (!) 183/91     Systolic BP Percentile --      Diastolic BP Percentile --      Pulse Rate 10/11/23 2033 (!) 115     Resp 10/11/23 2033 20     Temp 10/11/23 2033 98.8 F (37.1 C)     Temp Source 10/11/23 2033 Oral     SpO2 10/11/23 2033 100 %     Weight 10/11/23 2032 188 lb (85.3 kg)     Height 10/11/23 2032 5\' 7"  (1.702 m)     Head Circumference --      Peak Flow --      Pain Score 10/11/23 2032 0     Pain Loc --      Pain Education --      Exclude from Growth Chart --     Updated Vital Signs: BP (!) 153/77 (BP Location: Right Arm)   Pulse 97   Temp 98.7 F (37.1 C) (Oral)   Resp 18  Ht 5\' 7"  (1.702 m)   Wt 85.3 kg   LMP 09/20/2023 (Exact Date)   SpO2 100%   BMI 29.44 kg/m    General: Awake, mild distress.  CV:  RRR.  Good peripheral perfusion.  Resp:  Normal effort.  CTAB. Abd:  Nontender.  No distention.  Other:  Nasal congestion noted.  Posterior oropharynx mildly erythematous without tonsillar swelling, exudates or peritonsillar abscess.  There is no hoarse or muffled voice.  There is no drooling.  No anterior cervical LAD.  No petechiae.  Supple neck without meningismus.   ED Results / Procedures / Treatments  Labs (all labs ordered are listed, but only abnormal results are displayed) Labs Reviewed  CBC WITH DIFFERENTIAL/PLATELET - Abnormal; Notable for the following components:      Result Value   WBC 12.6 (*)    Hemoglobin 8.0 (*)    HCT 27.0 (*)    MCV 57.8 (*)    MCH 17.1 (*)    MCHC 29.6 (*)    RDW 21.3 (*)    Platelets 451 (*)    Neutro Abs 8.7 (*)    Abs Immature Granulocytes 0.09 (*)    All other components within  normal limits  COMPREHENSIVE METABOLIC PANEL WITH GFR - Abnormal; Notable for the following components:   Glucose, Bld 102 (*)    AST 49 (*)    All other components within normal limits  URINALYSIS, W/ REFLEX TO CULTURE (INFECTION SUSPECTED) - Abnormal; Notable for the following components:   Color, Urine STRAW (*)    APPearance CLEAR (*)    Hgb urine dipstick SMALL (*)    Bacteria, UA RARE (*)    All other components within normal limits  RESP PANEL BY RT-PCR (RSV, FLU A&B, COVID)  [id number]  CULTURE, BLOOD (ROUTINE X 2)  CULTURE, BLOOD (ROUTINE X 2)  LACTIC ACID, PLASMA  MONONUCLEOSIS SCREEN  TROPONIN I (HIGH SENSITIVITY)  TROPONIN I (HIGH SENSITIVITY)     EKG  ED ECG REPORT I, Blaze Sandin J, the attending physician, personally viewed and interpreted this ECG.   Date: 10/11/2023  EKG Time: 0055  Rate: 94  Rhythm: normal sinus rhythm  Axis: Normal  Intervals:none  ST&T Change: Nonspecific    RADIOLOGY I have independently visualized and interpreted patient's imaging study as well as noted the radiology interpretation:  Chest x-ray: No acute cardiopulmonary process  Official radiology report(s): DG Chest 2 View Result Date: 10/11/2023 CLINICAL DATA:  Fever with cough and congestion. EXAM: CHEST - 2 VIEW COMPARISON:  September 25, 2023 FINDINGS: The heart size and mediastinal contours are within normal limits. Both lungs are clear. The visualized skeletal structures are unremarkable. IMPRESSION: No active cardiopulmonary disease. Electronically Signed   By: Aram Candela M.D.   On: 10/11/2023 22:49     PROCEDURES:  Critical Care performed: No  .1-3 Lead EKG Interpretation  Performed by: Irean Hong, MD Authorized by: Irean Hong, MD     Interpretation: normal     ECG rate:  98   ECG rate assessment: normal     Rhythm: sinus rhythm     Ectopy: none     Conduction: normal   Comments:     Patient placed on cardiac monitor to evaluate for  arrhythmia    MEDICATIONS ORDERED IN ED: Medications  sodium chloride 0.9 % bolus 1,000 mL (0 mLs Intravenous Stopped 10/12/23 0345)     IMPRESSION / MDM / ASSESSMENT AND PLAN / ED COURSE  I reviewed  the triage vital signs and the nursing notes.                             48 year old female presenting with elevated blood pressure and cold-like symptoms.  Differential diagnosis includes but is not limited to ACS, infectious, metabolic etiologies, etc.  I personally reviewed patient's records from her ED visit on 09/25/2023 where she was found to have anemia, started on iron tablets and prescribed amlodipine for hypertension.  Patient's presentation is most consistent with acute complicated illness / injury requiring diagnostic workup.  The patient is on the cardiac monitor to evaluate for evidence of arrhythmia and/or significant heart rate changes.  Recheck temperature 98.5 F orally.  Respiratory panel and chest x-ray negative.  Will obtain sepsis workup, administer IV fluids and reassess.  Clinical Course as of 10/12/23 1610  Tue Oct 12, 2023  0308 Patient feeling better.  Blood pressure improved.  Heart rate improved.  Repeat troponin remains negative.  Hemoglobin stable from 2 weeks ago.  Electrolytes unremarkable.  Monospot negative.  UA negative.  Patient likely has viral illness; will discharge home with Tussionex to use as needed.  Strict return precautions given.  Patient verbalizes understanding and agrees with plan of care. [JS]    Clinical Course User Index [JS] Irean Hong, MD     FINAL CLINICAL IMPRESSION(S) / ED DIAGNOSES   Final diagnoses:  Hypertension, unspecified type  Viral illness     Rx / DC Orders   ED Discharge Orders          Ordered    chlorpheniramine-HYDROcodone (TUSSIONEX) 10-8 MG/5ML  Every 12 hours PRN        10/12/23 0309             Note:  This document was prepared using Dragon voice recognition software and may include  unintentional dictation errors.   Irean Hong, MD 10/12/23 [phone]

## 2023-10-11 NOTE — ED Triage Notes (Signed)
 Pt reports she noticed her BP was high tonight and she has developed fever cough and congestion. Pt has hx HTN, takes amlodipine.

## 2023-10-12 LAB — URINALYSIS, W/ REFLEX TO CULTURE (INFECTION SUSPECTED)
Bilirubin Urine: NEGATIVE
Glucose, UA: NEGATIVE mg/dL
Ketones, ur: NEGATIVE mg/dL
Leukocytes,Ua: NEGATIVE
Nitrite: NEGATIVE
Protein, ur: NEGATIVE mg/dL
Specific Gravity, Urine: 1.008 (ref 1.005–1.030)
pH: 7 (ref 5.0–8.0)

## 2023-10-12 LAB — CBC WITH DIFFERENTIAL/PLATELET
Abs Immature Granulocytes: 0.09 10*3/uL — ABNORMAL HIGH (ref 0.00–0.07)
Basophils Absolute: 0.1 10*3/uL (ref 0.0–0.1)
Basophils Relative: 1 %
Eosinophils Absolute: 0 10*3/uL (ref 0.0–0.5)
Eosinophils Relative: 0 %
HCT: 27 % — ABNORMAL LOW (ref 36.0–46.0)
Hemoglobin: 8 g/dL — ABNORMAL LOW (ref 12.0–15.0)
Immature Granulocytes: 1 %
Lymphocytes Relative: 21 %
Lymphs Abs: 2.7 10*3/uL (ref 0.7–4.0)
MCH: 17.1 pg — ABNORMAL LOW (ref 26.0–34.0)
MCHC: 29.6 g/dL — ABNORMAL LOW (ref 30.0–36.0)
MCV: 57.8 fL — ABNORMAL LOW (ref 80.0–100.0)
Monocytes Absolute: 1 10*3/uL (ref 0.1–1.0)
Monocytes Relative: 8 %
Neutro Abs: 8.7 10*3/uL — ABNORMAL HIGH (ref 1.7–7.7)
Neutrophils Relative %: 69 %
Platelets: 451 10*3/uL — ABNORMAL HIGH (ref 150–400)
RBC: 4.67 MIL/uL (ref 3.87–5.11)
RDW: 21.3 % — ABNORMAL HIGH (ref 11.5–15.5)
Smear Review: NORMAL
WBC: 12.6 10*3/uL — ABNORMAL HIGH (ref 4.0–10.5)
nRBC: 0 % (ref 0.0–0.2)

## 2023-10-12 LAB — COMPREHENSIVE METABOLIC PANEL WITH GFR
ALT: 44 U/L (ref 0–44)
AST: 49 U/L — ABNORMAL HIGH (ref 15–41)
Albumin: 4 g/dL (ref 3.5–5.0)
Alkaline Phosphatase: 60 U/L (ref 38–126)
Anion gap: 7 (ref 5–15)
BUN: 8 mg/dL (ref 6–20)
CO2: 25 mmol/L (ref 22–32)
Calcium: 9 mg/dL (ref 8.9–10.3)
Chloride: 107 mmol/L (ref 98–111)
Creatinine, Ser: 0.5 mg/dL (ref 0.44–1.00)
GFR, Estimated: >60 mL/min (ref >60)
Glucose, Bld: 102 mg/dL — ABNORMAL HIGH (ref 70–99)
Potassium: 3.7 mmol/L (ref 3.5–5.1)
Sodium: 139 mmol/L (ref 135–145)
Total Bilirubin: 0.6 mg/dL (ref 0.0–1.2)
Total Protein: 7.3 g/dL (ref 6.5–8.1)

## 2023-10-12 LAB — LACTIC ACID, PLASMA: Lactic Acid, Venous: 1.7 mmol/L (ref 0.5–1.9)

## 2023-10-12 LAB — MONONUCLEOSIS SCREEN: Mono Screen: NEGATIVE

## 2023-10-12 LAB — TROPONIN I (HIGH SENSITIVITY)
Troponin I (High Sensitivity): 13 ng/L (ref <18)
Troponin I (High Sensitivity): 14 ng/L (ref <18)

## 2023-10-12 MED ORDER — HYDROCOD POLI-CHLORPHE POLI ER 10-8 MG/5ML PO SUER: 5.0000 mL | Freq: Two times a day (BID) | ORAL | 0 refills | Status: DC | PRN

## 2023-10-12 NOTE — Discharge Instructions (Signed)
 Continue to take your blood pressure medicine daily.  You may take Tussionex as needed for cough.  Do not take decongestants as these will elevate your blood pressure.  Return to the ER for worsening symptoms, persistent vomiting, difficulty breathing or other concerns.

## 2023-10-17 LAB — CULTURE, BLOOD (ROUTINE X 2)
Culture: NO GROWTH
Culture: NO GROWTH

## 2023-12-09 ENCOUNTER — Telehealth: Payer: Self-pay | Admitting: Family Medicine

## 2023-12-09 ENCOUNTER — Ambulatory Visit: Payer: Self-pay

## 2023-12-09 NOTE — Telephone Encounter (Signed)
 Pt is scheduled for STI visit to refill medication.

## 2023-12-10 NOTE — Progress Notes (Signed)
 Erroneous Encounter-Disregard.  Patient did not show for appointment. No charge.  Leary Provencal L. Averlee Swartz, FNP-C

## 2023-12-13 ENCOUNTER — Ambulatory Visit: Payer: Self-pay | Admitting: Family Medicine

## 2023-12-13 ENCOUNTER — Encounter: Payer: Self-pay | Admitting: Family Medicine

## 2023-12-13 DIAGNOSIS — E049 Nontoxic goiter, unspecified: Secondary | ICD-10-CM

## 2023-12-13 DIAGNOSIS — F419 Anxiety disorder, unspecified: Secondary | ICD-10-CM | POA: Insufficient documentation

## 2023-12-13 DIAGNOSIS — I159 Secondary hypertension, unspecified: Secondary | ICD-10-CM | POA: Insufficient documentation

## 2023-12-13 DIAGNOSIS — B009 Herpesviral infection, unspecified: Secondary | ICD-10-CM

## 2023-12-13 MED ORDER — ACYCLOVIR 800 MG PO TABS
800.0000 mg | ORAL_TABLET | Freq: Two times a day (BID) | ORAL | 6 refills | Status: AC
Start: 2023-12-13 — End: ?

## 2023-12-13 MED ORDER — AMLODIPINE BESYLATE 5 MG PO TABS: 5.0000 mg | ORAL_TABLET | Freq: Every day | ORAL | 2 refills | Status: AC

## 2023-12-13 NOTE — Assessment & Plan Note (Signed)
 Kaylee Malone accepted referral to counseling at this time. Referral placed.

## 2023-12-13 NOTE — Assessment & Plan Note (Signed)
 Refilled amlodipine  so she has medication to last until establishing with her new PCP in August.

## 2023-12-13 NOTE — Progress Notes (Signed)
 [facility] Department STI clinic [address], [address] [place] [address] Main phone: [phone]  STI screening visit  Subjective:  Kaylee Malone is a 48 y.o. female being seen today for an STI screening visit. The patient reports they do not have symptoms.  Patient reports that they do not desire a pregnancy in the next year.   They reported they are not interested in discussing contraception today.    Patient's last menstrual period was 11/21/2023 (exact date).  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 She reports about 2 outbreaks per year. She is happy with her episodic treatment and does not want to change anything.   Does the patient using douching products? No  See flowsheet for further details and programmatic requirements Hyperlink available at the top of the signed note in blue.  Flow sheet content below:  Pregnancy Intention Screening Does the patient want to become pregnant in the next year?: No Does the patient's partner want to become pregnant in the next year?: No Would the patient like to discuss contraceptive options today?: No All Patients Anyone smoke around pt and/or pt's children?: No Anyone smoke inside pt's house?: No Anyone smoke inside car?: No Anyone smoke inside the workplace?: No Reason For STD Screen STD Screening: Is asymptomatic Have you ever had an STD?: Yes History of Antibiotic use in the past 2 weeks?: No STD Symptoms Denies  all: Yes Risk Factors for Hep B Household, sexual, or needle sharing contact of a person infected with Hep B: No Sexual contact with a person who uses drugs not as prescribed?: No Currently or Ever used drugs not as prescribed: No HIV Positive: No PRep Patient: No Men who have sex with men: N/A Have Hepatitis C: No History of Incarceration: No History of Homeslessness?: No Anal sex following anal drug use?: No Risk Factors for Hep C Currently using drugs not as prescribed: No Sexual partner(s) currently using drugs as not prescribed: No History of drug use: No HIV Positive: No People with a history of incarceration: No People born between the years of 38 and 2: No Abuse History Has patient ever been abused physically?: No Has patient ever been abused sexually?: No Does patient feel they have a problem with Anxiety?: Yes Does patient feel they have a problem with Depression?: Yes Referral to Behavioral Health: Yes Counseling Patient counseled to use condoms with all sex: Condoms declined RTC in 2-3 weeks for test results: Yes Clinic will call if test results abnormal before test result appt.: Yes Test results given to patient Patient counseled to use condoms with all sex: Condoms declined   Screening for MPX risk: Does the patient have an unexplained rash? No Is the patient MSM? No Does the patient endorse multiple sex partners or anonymous sex partners? No Did the patient have close or sexual contact with a person diagnosed with MPX? No Has the patient traveled outside the [place]  where MPX is endemic? No Is there a high clinical suspicion for  MPX-- evidenced by one of the following No  -Unlikely to be chickenpox  -Lymphadenopathy  -Rash that present in same phase of evolution on any given body part  Screenings: Last HIV test per patient/review of record was  Lab Results  Component Value Date   HMHIVSCREEN Negative - Validated 06/04/2022   No results found for: "HIV"    Last HEPC test per patient/review of record was  Lab Results  Component Value Date   HMHEPCSCREEN Negative-Validated 06/04/2022   No components found for: "HEPC"   Last HEPB test per patient/review of record was No components found for: "HMHEPBSCREEN"   Patient reports last pap was:   No results found for: "SPECADGYN" No Cervical Cancer Screening results to display.  Immunization history:  Immunization History  Administered Date(s) Administered   Hep A / Hep B 04/26/2007, 07/19/2007   Tdap 04/26/2007    The following portions of the patient's history were reviewed and updated as appropriate: allergies, current medications, past medical history, past social history, past surgical history and problem list.  Objective:  There were no vitals filed for this visit.  Physical Exam Vitals and nursing note reviewed.  Constitutional:      General: She is not in acute distress.    Appearance: Normal appearance. She is normal weight. She is not toxic-appearing.  HENT:     Head: Normocephalic.     Mouth/Throat:     Mouth: Mucous membranes are moist.  Cardiovascular:     Rate and Rhythm: Normal rate.  Pulmonary:     Effort: Pulmonary effort is normal.  Chest:       Comments: Uniformly bilaterally enlarged thyroid , moderately firm to palpation  Abdominal:     Palpations: Abdomen is soft.  Genitourinary:    Comments: Declined genital exam- no symptoms, self swabbed Musculoskeletal:        General: Normal range of motion.  Lymphadenopathy:     Head:     Right side of head: No submandibular, preauricular or posterior auricular adenopathy.     Left side of head: No submandibular, preauricular or posterior auricular adenopathy.     Cervical: No cervical adenopathy.     Upper Body:     Right upper body: No supraclavicular or axillary adenopathy.     Left upper body: No supraclavicular or axillary adenopathy.  Skin:    General: Skin is warm and dry.  Neurological:     Mental  Status: She is alert and oriented to person, place, and time.  Psychiatric:        Mood and Affect: Mood normal.    Assessment and Plan:  Kaylee Malone is a 48 y.o. female presenting to the [facility] Department for STI screening  HSV infection Assessment & Plan: Chronic, stable. Refilled acyclovir  script with 6 refills. No changes to medications at this time.   Orders: -     Acyclovir ; Take 1 tablet (800 mg total) by mouth 2 (two) times daily.  Dispense: 10 tablet; Refill: 6  Enlarged thyroid  Assessment & Plan: Bilateral enlarged thyroid  on physical exam. She reports this is chronic and slowly worsening. Discussed bringing this concern to her new PCP for thyroid  labs and US .    Anxious mood Assessment & Plan: Ms. Bordenave accepted referral to counseling at this time. Referral placed.   Orders: -     Ambulatory referral to Behavioral Health  Secondary hypertension Assessment & Plan: Refilled amlodipine  so she has medication to last until establishing with her new  PCP in August.   Orders: -     amLODIPine  Besylate; Take 1 tablet (5 mg total) by mouth daily.  Dispense: 30 tablet; Refill: 2   Patient accepted the following screenings: None Patient meets criteria for HepB screening? No. Ordered? not applicable Patient meets criteria for HepC screening? No. Ordered? not applicable  Treat wet prep per standing order Discussed time line for State Lab results and that patient will be called with positive results and encouraged patient to call if she had not heard in 2 weeks.  Counseled to return or seek care for continued or worsening symptoms Recommended repeat testing in 3 months with positive results. Recommended condom use with all sex for STI prevention.   Patient is currently using [facility] to prevent pregnancy.    No follow-ups on file.  No future appointments.  Jack Marts, MD

## 2023-12-13 NOTE — Assessment & Plan Note (Signed)
 Chronic, stable. Refilled acyclovir  script with 6 refills. No changes to medications at this time.

## 2023-12-13 NOTE — Assessment & Plan Note (Signed)
 Bilateral enlarged thyroid  on physical exam. She reports this is chronic and slowly worsening. Discussed bringing this concern to her new PCP for thyroid  labs and US .

## 2024-03-14 ENCOUNTER — Other Ambulatory Visit: Payer: Self-pay | Admitting: Family Medicine

## 2024-03-14 DIAGNOSIS — I159 Secondary hypertension, unspecified: Secondary | ICD-10-CM

## 2024-03-22 ENCOUNTER — Other Ambulatory Visit: Payer: Self-pay | Admitting: Medical Genetics
